# Patient Record
Sex: Male | Born: 1990
Health system: Southern US, Community
[De-identification: ages and names within clinical notes are randomized; demographics above are authoritative.]

## PROBLEM LIST (undated history)

## (undated) DIAGNOSIS — J45909 Unspecified asthma, uncomplicated: Secondary | ICD-10-CM

## (undated) DIAGNOSIS — F419 Anxiety disorder, unspecified: Secondary | ICD-10-CM

## (undated) DIAGNOSIS — M1990 Unspecified osteoarthritis, unspecified site: Secondary | ICD-10-CM

## (undated) DIAGNOSIS — Z8669 Personal history of other diseases of the nervous system and sense organs: Secondary | ICD-10-CM

## (undated) DIAGNOSIS — T7840XA Allergy, unspecified, initial encounter: Secondary | ICD-10-CM

## (undated) DIAGNOSIS — A770 Spotted fever due to Rickettsia rickettsii: Secondary | ICD-10-CM

## (undated) HISTORY — DX: Spotted fever due to Rickettsia rickettsii: A77.0

## (undated) HISTORY — PX: WISDOM TOOTH EXTRACTION: SHX21

## (undated) HISTORY — DX: Personal history of other diseases of the nervous system and sense organs: Z86.69

## (undated) HISTORY — PX: TONSILLECTOMY: SUR1361

## (undated) HISTORY — DX: Allergy, unspecified, initial encounter: T78.40XA

## (undated) HISTORY — DX: Anxiety disorder, unspecified: F41.9

---
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
## 2009-06-26 ENCOUNTER — Emergency Department (HOSPITAL_BASED_OUTPATIENT_CLINIC_OR_DEPARTMENT_OTHER): Admission: EM | Admit: 2009-06-26 | Discharge: 2009-06-26 | Payer: Self-pay | Admitting: Emergency Medicine

## 2010-12-13 ENCOUNTER — Inpatient Hospital Stay (INDEPENDENT_AMBULATORY_CARE_PROVIDER_SITE_OTHER)
Admission: RE | Admit: 2010-12-13 | Discharge: 2010-12-13 | Disposition: A | Discharge status: Home / Self Care | Payer: PRIVATE HEALTH INSURANCE | Source: Ambulatory Visit | Attending: Family Medicine | Admitting: Family Medicine

## 2010-12-13 ENCOUNTER — Emergency Department (HOSPITAL_COMMUNITY)
Admission: EM | Admit: 2010-12-13 | Discharge: 2010-12-14 | Disposition: A | Discharge status: Home / Self Care | Payer: PRIVATE HEALTH INSURANCE | Attending: Emergency Medicine | Admitting: Emergency Medicine

## 2010-12-13 ENCOUNTER — Emergency Department (HOSPITAL_COMMUNITY): Payer: PRIVATE HEALTH INSURANCE

## 2010-12-13 DIAGNOSIS — J45909 Unspecified asthma, uncomplicated: Secondary | ICD-10-CM | POA: Insufficient documentation

## 2010-12-13 DIAGNOSIS — R197 Diarrhea, unspecified: Secondary | ICD-10-CM | POA: Insufficient documentation

## 2010-12-13 DIAGNOSIS — R109 Unspecified abdominal pain: Secondary | ICD-10-CM | POA: Insufficient documentation

## 2010-12-13 DIAGNOSIS — R630 Anorexia: Secondary | ICD-10-CM | POA: Insufficient documentation

## 2010-12-13 DIAGNOSIS — R1110 Vomiting, unspecified: Secondary | ICD-10-CM | POA: Insufficient documentation

## 2010-12-13 DIAGNOSIS — R1031 Right lower quadrant pain: Secondary | ICD-10-CM

## 2010-12-13 DIAGNOSIS — F411 Generalized anxiety disorder: Secondary | ICD-10-CM | POA: Insufficient documentation

## 2010-12-13 LAB — DIFFERENTIAL
Basophils Absolute: 0 10*3/uL (ref 0.0–0.1)
Basophils Relative: 1 % (ref 0–1)
Eosinophils Absolute: 0.2 10*3/uL (ref 0.0–0.7)
Eosinophils Relative: 4 % (ref 0–5)
Lymphocytes Relative: 32 % (ref 12–46)
Lymphs Abs: 2 10*3/uL (ref 0.7–4.0)
Monocytes Absolute: 0.5 10*3/uL (ref 0.1–1.0)
Monocytes Relative: 8 % (ref 3–12)
Neutro Abs: 3.5 10*3/uL (ref 1.7–7.7)
Neutrophils Relative %: 56 % (ref 43–77)

## 2010-12-13 LAB — CBC
HCT: 43.8 % (ref 39.0–52.0)
Hemoglobin: 15.2 g/dL (ref 13.0–17.0)
MCH: 30.5 pg (ref 26.0–34.0)
MCHC: 34.7 g/dL (ref 30.0–36.0)
MCV: 88 fL (ref 78.0–100.0)
Platelets: 304 10*3/uL (ref 150–400)
RBC: 4.98 MIL/uL (ref 4.22–5.81)
RDW: 12.4 % (ref 11.5–15.5)
WBC: 6.2 10*3/uL (ref 4.0–10.5)

## 2010-12-13 LAB — URINALYSIS, ROUTINE W REFLEX MICROSCOPIC
Bilirubin Urine: NEGATIVE
Glucose, UA: NEGATIVE mg/dL
Hgb urine dipstick: NEGATIVE
Ketones, ur: 15 mg/dL — AB
Nitrite: NEGATIVE
Protein, ur: NEGATIVE mg/dL
Specific Gravity, Urine: 1.027 (ref 1.005–1.030)
Urobilinogen, UA: 0.2 mg/dL (ref 0.0–1.0)
pH: 5.5 (ref 5.0–8.0)

## 2010-12-14 MED ORDER — IOHEXOL 300 MG/ML  SOLN
80.0000 mL | Freq: Once | INTRAMUSCULAR | Status: AC | PRN
  Administered 2010-12-14: 80 mL via INTRAVENOUS

## 2011-01-16 NOTE — Op Note (Signed)
NAMEQuill, Grinder Marquice                  ACCOUNT NO.:  [id number]   MEDICAL RECORD NO.:  [id number]          PATIENT TYPE:  AMB   LOCATION:  DSC                          FACILITY:  [facility]   PHYSICIAN:  Suzanna Obey, M.D.       DATE OF BIRTH:  1991-01-03   DATE OF PROCEDURE:  01/15/2008  DATE OF DISCHARGE:                               OPERATIVE REPORT   PREOPERATIVE DIAGNOSIS:  Chronic tonsillitis.   POSTOPERATIVE DIAGNOSIS:  Chronic tonsillitis.   SURGICAL PROCEDURE:  Bilateral tonsillectomy.   ANESTHESIA:  General.   ESTIMATED BLOOD LOSS:  Less than 10 mL.   INDICATIONS:  This is a 20 year old with problems of chronic tonsillitis  who had been refractory to medical therapy.  He as well as his parents  were informed the risks and benefits of the procedure and options were  discussed.  All questions were answered and consent was obtained.   OPERATION:  The patient was taken to the operating room and placed in a  supine position after adequate general endotracheal tube anesthesia, he  was placed in the Rose position, and draped in the usual sterile manner.  The left tonsil, I began making a left anterior tonsillar pillar  incision, identifying the capsule of tonsil and removing it with  electrocautery dissection and extended pretty far down the lateral  pharyngeal wall into the lingual tonsil area.  It was very large and  very cryptic with debris.  Right tonsil was the same.  The area was  irrigated with saline and there was good hemostasis.  The Crowe-Davis  was released and resuspended.  The patient was then awakened after  removing the Crowe-Davis, brought to recovery room in stable condition.  Counts were correct.           ______________________________  Suzanna Obey, M.D.     JB/MEDQ  D:  01/15/2008  T:  01/15/2008  Job:  [id number]   cc:   [facility]

## 2011-01-19 NOTE — Discharge Summary (Signed)
[place]. [facility]  Patient:    GIANFRANCO, ARAKI                           MRN: [id number] Adm. Date:  [id number] Disc. Date: [id number] Attending:  Delle Reining Dictator:   Florentina Jenny, M.D.                           Discharge Summary  HISTORY OF PRESENT ILLNESS:  Emile is a 20 year old male who is admitted with a history of elevated fever to 103 degrees, rash, and swelling along his face. He had a full septic work-up, including blood cultures and a lumbar puncture. The lumbar puncture results were normal and his CSF culture was negative.  His blood culture was also negative.  HOSPITAL COURSE:  He was admitted and started on IV fluids and observed.  He was also started on doxycycline for the possibility of [facility] spotted fever.  He also did have EBV and RMSF titers, which are pending at the time of dictation.  Upon discharge, the patient was tolerating good p.o., was back to normal activity, and was afebrile with p.r.n. Motrin.  DISCHARGE MEDICATIONS:  Doxycycline 100 mg p.o. b.i.d. for seven days.  FOLLOW-UP:  He was to follow up with Dr. Doreatha Massed office and to schedule an appointment if he was not doing well.  DISCHARGE INSTRUCTIONS:  He is to return to normal diet and activity.  DISCHARGE DIAGNOSIS:  Probable viral syndrome, rule out [facility] spotted fever. DD:  01/08/01 TD:  01/09/01 Job: [id number]

## 2011-05-25 LAB — INFLUENZA A AND B ANTIGEN (CONVERTED LAB): Influenza B Ag: NEGATIVE

## 2012-03-30 ENCOUNTER — Emergency Department (HOSPITAL_BASED_OUTPATIENT_CLINIC_OR_DEPARTMENT_OTHER)
Admission: EM | Admit: 2012-03-30 | Discharge: 2012-03-30 | Disposition: A | Discharge status: Home / Self Care | Payer: PRIVATE HEALTH INSURANCE | Attending: Emergency Medicine | Admitting: Emergency Medicine

## 2012-03-30 ENCOUNTER — Encounter (HOSPITAL_BASED_OUTPATIENT_CLINIC_OR_DEPARTMENT_OTHER): Payer: Self-pay | Admitting: *Deleted

## 2012-03-30 DIAGNOSIS — M129 Arthropathy, unspecified: Secondary | ICD-10-CM | POA: Insufficient documentation

## 2012-03-30 DIAGNOSIS — J029 Acute pharyngitis, unspecified: Secondary | ICD-10-CM | POA: Insufficient documentation

## 2012-03-30 HISTORY — DX: Unspecified osteoarthritis, unspecified site: M19.90

## 2012-03-30 LAB — RAPID STREP SCREEN (MED CTR MEBANE ONLY): Streptococcus, Group A Screen (Direct): NEGATIVE

## 2012-03-30 MED ORDER — IBUPROFEN 600 MG PO TABS: 600.0000 mg | ORAL_TABLET | Freq: Four times a day (QID) | ORAL | Status: AC | PRN

## 2012-03-30 MED ORDER — LIDOCAINE VISCOUS 2 % MT SOLN
20.0000 mL | Freq: Once | OROMUCOSAL | Status: AC
  Administered 2012-03-30: 15 mL via OROMUCOSAL
  Filled 2012-03-30: qty 15

## 2012-03-30 NOTE — ED Provider Notes (Signed)
History  This chart was scribed for Joseph Xie Smitty Cords, MD by Shari Heritage. The patient was seen in room MH03/MH03. Patient's care was started at 0048.      CSN: [id number]  Arrival date & time 03/30/12  0048   First MD Initiated Contact with Patient 03/30/12 [phone]      Chief Complaint  Patient presents with  . Sore Throat    (Consider location/radiation/quality/duration/timing/severity/associated sxs/prior treatment) Patient is a 21 y.o. male presenting with pharyngitis. The history is provided by the patient and the spouse.  Sore Throat This is a new problem. The current episode started more than 2 days ago. The problem occurs constantly. The problem has not changed since onset.Pertinent negatives include no chest pain, no abdominal pain, no headaches and no shortness of breath. Nothing aggravates the symptoms. The symptoms are relieved by narcotics. Treatments tried: vicodin. The treatment provided significant relief.   Jackston Oaxaca is a 21 y.o. male who presents to the Emergency Department complaining of intermittent sore throat that is moderate in severity onset 2 days ago. Patient has tried Hydrocodone with relief from symptoms. Patient denies any other symptoms. No f/c/r no change in voice no SOB no noisy breathing.  He has a medical history of arthritis. Patient has a surgical h/o tonsillectomy.  Past Medical History  Diagnosis Date  . Arthritis     Past Surgical History  Procedure Date  . Tonsillectomy     History reviewed. No pertinent family history.  History  Substance Use Topics  . Smoking status: Not on file  . Smokeless tobacco: Not on file  . Alcohol Use:       Review of Systems  Constitutional: Negative for fever and fatigue.  HENT: Positive for sore throat. Negative for drooling, trouble swallowing, neck pain, neck stiffness and voice change.   Respiratory: Negative for shortness of breath.   Cardiovascular: Negative for chest pain.    Gastrointestinal: Negative for abdominal pain.  Neurological: Negative for headaches.  All other systems reviewed and are negative.   A complete 10 system review of systems was obtained and all systems are negative except as noted in the HPI and PMH.   Allergies  Review of patient's allergies indicates no known allergies.  Home Medications   Current Outpatient Rx  Name Route Sig Dispense Refill  . ALBUTEROL SULFATE HFA 108 (90 BASE) MCG/ACT IN AERS Inhalation Inhale 2 puffs into the lungs every 6 (six) hours as needed.      BP 138/84  Pulse 70  Temp 98.4 F (36.9 C) (Oral)  Resp 20  Ht 5\' 9"  (1.753 m)  Wt 155 lb (70.308 kg)  BMI 22.89 kg/m2  SpO2 100%  Physical Exam  Nursing note reviewed. Constitutional: He is oriented to person, place, and time. He appears well-developed and well-nourished.  HENT:  Head: Normocephalic and atraumatic.  Mouth/Throat: Oropharynx is clear and moist. No oropharyngeal exudate.       No swelling of salivary glands.  Eyes: Conjunctivae are normal. Pupils are equal, round, and reactive to light.  Neck: Normal range of motion. Neck supple. No tracheal deviation present. No thyromegaly present.       Trachea is midline.  Cardiovascular: Normal rate and regular rhythm.   Pulmonary/Chest: Effort normal and breath sounds normal. No stridor. No respiratory distress. He has no wheezes. He has no rales.  Abdominal: Soft. Normal appearance and bowel sounds are normal. He exhibits no distension. There is no tenderness. There is no rebound and no  guarding.  Musculoskeletal: Normal range of motion.  Lymphadenopathy:    He has no cervical adenopathy.  Neurological: He is alert and oriented to person, place, and time. He has normal reflexes.  Skin: Skin is warm and dry.  Psychiatric: He has a normal mood and affect.    ED Course  Procedures (including critical care time) DIAGNOSTIC STUDIES: Oxygen Saturation is 100% on room air, normal by my  interpretation.    COORDINATION OF CARE: 1:10am- Patient informed of current plan for treatment and evaluation and agrees with plan at this time. Have ordered strep test.  Results for orders placed during the hospital encounter of 03/30/12  RAPID STREP SCREEN      Component Value Range   Streptococcus, Group A Screen (Direct) NEGATIVE  NEGATIVE     No diagnosis found.    MDM  Return for inability to open mouth, inability to swallow, change in voice, swelling or any SOB. Follow up with family doctor within two days.  Patient verbalizes understanding and agrees to follow up      I personally performed the services described in this documentation, which was scribed in my presence. The recorded information has been reviewed and considered.    Jasmine Awe, MD 03/30/12 [phone]

## 2012-03-30 NOTE — ED Notes (Signed)
Pt presents to ED today with sore throat since Wed/Thurs.  Pt states associated sx with HA.  Pt has tried several OTC remedies with no relief in sx.  Swab sent from triage

## 2013-12-17 ENCOUNTER — Emergency Department (HOSPITAL_BASED_OUTPATIENT_CLINIC_OR_DEPARTMENT_OTHER)
Admission: EM | Admit: 2013-12-17 | Discharge: 2013-12-17 | Disposition: A | Discharge status: Home / Self Care | Payer: BC Managed Care – PPO | Attending: Emergency Medicine | Admitting: Emergency Medicine

## 2013-12-17 ENCOUNTER — Encounter (HOSPITAL_BASED_OUTPATIENT_CLINIC_OR_DEPARTMENT_OTHER): Payer: Self-pay | Admitting: Emergency Medicine

## 2013-12-17 ENCOUNTER — Emergency Department (HOSPITAL_BASED_OUTPATIENT_CLINIC_OR_DEPARTMENT_OTHER): Payer: BC Managed Care – PPO

## 2013-12-17 DIAGNOSIS — M25512 Pain in left shoulder: Secondary | ICD-10-CM

## 2013-12-17 DIAGNOSIS — M129 Arthropathy, unspecified: Secondary | ICD-10-CM | POA: Insufficient documentation

## 2013-12-17 DIAGNOSIS — F17200 Nicotine dependence, unspecified, uncomplicated: Secondary | ICD-10-CM | POA: Insufficient documentation

## 2013-12-17 DIAGNOSIS — Z79899 Other long term (current) drug therapy: Secondary | ICD-10-CM | POA: Insufficient documentation

## 2013-12-17 DIAGNOSIS — M25519 Pain in unspecified shoulder: Secondary | ICD-10-CM | POA: Insufficient documentation

## 2013-12-17 MED ORDER — CYCLOBENZAPRINE HCL 10 MG PO TABS
5.0000 mg | ORAL_TABLET | Freq: Once | ORAL | Status: AC
  Administered 2013-12-17: 5 mg via ORAL
  Filled 2013-12-17: qty 1

## 2013-12-17 MED ORDER — HYDROCODONE-ACETAMINOPHEN 5-325 MG PO TABS: 1.0000 | ORAL_TABLET | Freq: Four times a day (QID) | ORAL | Status: DC | PRN

## 2013-12-17 MED ORDER — CYCLOBENZAPRINE HCL 10 MG PO TABS: 10.0000 mg | ORAL_TABLET | Freq: Two times a day (BID) | ORAL | Status: DC | PRN

## 2013-12-17 MED ORDER — HYDROMORPHONE HCL PF 1 MG/ML IJ SOLN
1.0000 mg | Freq: Once | INTRAMUSCULAR | Status: AC
  Administered 2013-12-17: 1 mg via INTRAMUSCULAR
  Filled 2013-12-17: qty 1

## 2013-12-17 NOTE — ED Notes (Signed)
Pt c/o left shoulder pain w/o injury x 2 days

## 2013-12-17 NOTE — ED Provider Notes (Signed)
CSN: [id number]     Arrival date & time 12/17/13  2027 History  This chart was scribed for Joseph HaitWilliam Nesreen Albano, MD by Joaquin MusicKristina Sanchez-Matthews, ED Scribe. This patient was seen in room MHT13/MHT13 and the patient's care was started at 9:14 PM.   Chief Complaint  Patient presents with  . Shoulder Pain   Patient is a 23 y.o. male presenting with shoulder injury. The history is provided by the patient. No language interpreter was used.  Shoulder Injury This is a new problem. The current episode started 2 days ago. The problem occurs constantly. The problem has not changed since onset.Nothing aggravates the symptoms. Nothing relieves the symptoms. He has tried nothing for the symptoms. The treatment provided no relief.   HPI Comments: Joseph Crossric Afonso is a 23 y.o. male who presents to the Emergency Department complaining of ongoing L shoulder pain that began 2 days ago. Pt states his pain is now radiating down his L arm and occasionally to his neck and can shoot up his head. Pt states he is unsure if he injured his shoulder/arm while at work. He states he is unable to do certain movements with his L arm secondary to pain. He is L handed. He reports taking OTC Ibuprofen at noon without relief. Pt denies any recent falls, neck pain or stiffness.   Past Medical History  Diagnosis Date  . Arthritis    Past Surgical History  Procedure Laterality Date  . Tonsillectomy     History reviewed. No pertinent family history. History  Substance Use Topics  . Smoking status: Current Every Day Smoker -- 0.50 packs/day    Types: Cigarettes  . Smokeless tobacco: Not on file  . Alcohol Use: No    Review of Systems  All other systems reviewed and are negative.  Allergies  Review of patient's allergies indicates no known allergies.  Home Medications   Prior to Admission medications   Medication Sig Start Date End Date Taking? Authorizing Provider  ibuprofen (ADVIL,MOTRIN) 800 MG tablet Take 800 mg by  mouth every 8 (eight) hours as needed.   Yes Historical Provider, MD  albuterol (PROVENTIL HFA;VENTOLIN HFA) 108 (90 BASE) MCG/ACT inhaler Inhale 2 puffs into the lungs every 6 (six) hours as needed.    Historical Provider, MD   Triage Vitals:BP 138/86  Pulse 65  Temp(Src) 98.2 F (36.8 C) (Oral)  Resp 18  Ht 5\' 9"  (1.753 m)  Wt 160 lb (72.576 kg)  BMI 23.62 kg/m2  SpO2 98%  Physical Exam  Nursing note and vitals reviewed. Constitutional: He is oriented to person, place, and time. He appears well-developed and well-nourished. No distress.  HENT:  Head: Normocephalic and atraumatic.  Eyes: EOM are normal.  Neck: Neck supple. No tracheal deviation present.  Cardiovascular: Normal rate.   Pulmonary/Chest: Effort normal. No respiratory distress.  Musculoskeletal:       Left shoulder: He exhibits decreased range of motion, tenderness (deltoid, diffuse), pain and spasm. He exhibits no bony tenderness, no swelling, no deformity, no laceration, normal pulse and normal strength.  Neurological: He is alert and oriented to person, place, and time.  Skin: Skin is warm and dry.  Psychiatric: He has a normal mood and affect. His behavior is normal.    ED Course  Procedures  DIAGNOSTIC STUDIES: Oxygen Saturation is 98% on RA, normal by my interpretation.    COORDINATION OF CARE: 9:17 PM-Discussed treatment plan which includes discuss radiology findings and will give pain shot while at work. Advised pt  to take Alieve, apply heat/ice and rest over the weekend. Pt agreed to plan.   Labs Review Labs Reviewed - No data to display  Imaging Review Dg Shoulder Left  12/17/2013   CLINICAL DATA:  SHOULDER PAIN  EXAM: LEFT SHOULDER - 2+ VIEW  COMPARISON:  None.  FINDINGS: There is no evidence of fracture or dislocation. There is no evidence of arthropathy or other focal bone abnormality. Soft tissues are unremarkable.  IMPRESSION: Negative.   Electronically Signed   By: Salome HolmesHector  Cooper M.D.   On:  12/17/2013 21:04     EKG Interpretation None     MDM   Final diagnoses:  Left shoulder pain    89M with L shoulder pain. No known injury. Xray negative. Pain worse with shoulder movement. No fevers, no joint swelling. On exam, no warmth, no swelling. No concern for septic arthritis. Pain with ROM, rotator cuff movements. ROM decreased, but still has some ROM preserved. NVI distally. Like musculoskeletal in origin. Given pain meds, instructed to use NSAIDs. Given time off work to recover.  I personally performed the services described in this documentation, which was scribed in my presence. The recorded information has been reviewed and is accurate.     Joseph HaitWilliam Ramata Strothman, MD 12/17/13 [phone]

## 2013-12-17 NOTE — Discharge Instructions (Signed)
   Shoulder Pain The shoulder is the joint that connects your arms to your body. The bones that form the shoulder joint include the upper arm bone (humerus), the shoulder blade (scapula), and the collarbone (clavicle). The top of the humerus is shaped like a ball and fits into a rather flat socket on the scapula (glenoid cavity). A combination of muscles and strong, fibrous tissues that connect muscles to bones (tendons) support your shoulder joint and hold the ball in the socket. Small, fluid-filled sacs (bursae) are located in different areas of the joint. They act as cushions between the bones and the overlying soft tissues and help reduce friction between the gliding tendons and the bone as you move your arm. Your shoulder joint allows a wide range of motion in your arm. This range of motion allows you to do things like scratch your back or throw a ball. However, this range of motion also makes your shoulder more prone to pain from overuse and injury. Causes of shoulder pain can originate from both injury and overuse and usually can be grouped in the following four categories:  Redness, swelling, and pain (inflammation) of the tendon (tendinitis) or the bursae (bursitis).  Instability, such as a dislocation of the joint.  Inflammation of the joint (arthritis).  Broken bone (fracture). HOME CARE INSTRUCTIONS   Apply ice to the sore area.  Put ice in a plastic bag.  Place a towel between your skin and the bag.  Leave the ice on for 15-20 minutes, 03-04 times per day for the first 2 days.  Stop using cold packs if they do not help with the pain.  If you have a shoulder sling or immobilizer, wear it as long as your caregiver instructs. Only remove it to shower or bathe. Move your arm as little as possible, but keep your hand moving to prevent swelling.  Squeeze a soft ball or foam pad as much as possible to help prevent swelling.  Only take over-the-counter or prescription medicines for  pain, discomfort, or fever as directed by your caregiver. SEEK MEDICAL CARE IF:   Your shoulder pain increases, or new pain develops in your arm, hand, or fingers.  Your hand or fingers become cold and numb.  Your pain is not relieved with medicines. SEEK IMMEDIATE MEDICAL CARE IF:   Your arm, hand, or fingers are numb or tingling.  Your arm, hand, or fingers are significantly swollen or turn white or blue. MAKE SURE YOU:   Understand these instructions.  Will watch your condition.  Will get help right away if you are not doing well or get worse. Document Released: 05/30/2005 Document Revised: 05/14/2012 Document Reviewed: 08/04/2011 Exit[employer] Patient Information 2014 [employer].  

## 2013-12-17 NOTE — ED Notes (Signed)
[facility] pt to follow up if pain > 1 week

## 2014-05-28 ENCOUNTER — Ambulatory Visit (INDEPENDENT_AMBULATORY_CARE_PROVIDER_SITE_OTHER): Payer: BC Managed Care – PPO | Admitting: Physician Assistant

## 2014-05-28 VITALS — BP 110/64 | HR 75 | Temp 98.0°F | Resp 16 | Ht 69.0 in | Wt 170.0 lb

## 2014-05-28 DIAGNOSIS — G4700 Insomnia, unspecified: Secondary | ICD-10-CM

## 2014-05-28 MED ORDER — DIAZEPAM 2 MG PO TABS: 1.0000 mg | ORAL_TABLET | Freq: Three times a day (TID) | ORAL | Status: DC | PRN

## 2014-05-28 MED ORDER — BUPROPION HCL ER (XL) 150 MG PO TB24: 150.0000 mg | ORAL_TABLET | Freq: Every day | ORAL | Status: DC

## 2014-05-28 NOTE — Progress Notes (Signed)
   Subjective:    Patient ID: Joseph Cordova, male    DOB: 11-07-1990, 23 y.o.   MRN: [id number]  HPI Pt presents to clinic for evaluation of anxiety, panic attacks in crowds (agitated and irritable) and ADD.  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  He has trouble with focus at work - trouble getting his tasks done - sometimes trouble because he starts multiple tasks and then gets not one completed and sometimes his mind goes in different directions and then he also gets nothing done.  He also has generalized anxiety most days that affects him.  He seems to have gotten worse with both of this since Oct 22, 2022 when his uncle died, who was his role model.)  He has some sad mood and some lack of interest in doing things that he used to enjoy.  He has been on Paxil in the past but never was comfortable with the doctor and was not really interested in doing the treatment.  Review of Systems  Neurological: Positive for headaches (for years).  Psychiatric/Behavioral: Positive for dysphoric mood (mild) and decreased concentration. The patient is nervous/anxious.        Objective:   Physical Exam  Vitals reviewed. Constitutional: He is oriented to person, place, and time. He appears well-developed and well-nourished.  HENT:  Head: Normocephalic and atraumatic.  Right Ear: External ear normal.  Left Ear: External ear normal.  Pulmonary/Chest: Effort normal.  Neurological: He is alert and oriented to person, place, and time.  Skin: Skin is warm and dry.  Psychiatric: He has a normal mood and affect. His behavior is normal. Judgment and thought content normal.  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  We are  going to start Wellbutrin which will help both his anxiety and his ADD.  We will also give him something to help with sleep and during panic attacks while the Wellbutrin is getting into his system.  Once we get his anxiety under controlled we will start to determine what aspect of his focus is still a problem and treat that.  He will recheck in 3 weeks.  Spent 30 mints face to face with patient taking his history.  Benny Lennert PA-C  [facility] 05/28/2014 4:07 PM

## 2014-06-01 ENCOUNTER — Encounter: Payer: Self-pay | Admitting: Physician Assistant

## 2014-06-20 ENCOUNTER — Other Ambulatory Visit: Payer: Self-pay | Admitting: Physician Assistant

## 2014-06-20 DIAGNOSIS — G4700 Insomnia, unspecified: Secondary | ICD-10-CM

## 2014-06-22 MED ORDER — BUPROPION HCL ER (XL) 300 MG PO TB24: 300.0000 mg | ORAL_TABLET | Freq: Every day | ORAL | Status: DC

## 2014-06-22 MED ORDER — CLONAZEPAM 0.5 MG PO TABS: 0.2500 mg | ORAL_TABLET | Freq: Three times a day (TID) | ORAL | Status: DC | PRN

## 2014-06-22 NOTE — Telephone Encounter (Signed)
Please send to the pharmacy.  Pt knows about the medication change through mychart.

## 2014-06-23 NOTE — Telephone Encounter (Signed)
Called in clonazepam.

## 2014-06-27 ENCOUNTER — Ambulatory Visit (INDEPENDENT_AMBULATORY_CARE_PROVIDER_SITE_OTHER): Payer: BC Managed Care – PPO | Admitting: Physician Assistant

## 2014-06-27 VITALS — BP 116/68 | HR 61 | Temp 97.9°F | Resp 18 | Ht 67.5 in | Wt 170.2 lb

## 2014-06-27 DIAGNOSIS — J4520 Mild intermittent asthma, uncomplicated: Secondary | ICD-10-CM

## 2014-06-27 DIAGNOSIS — R41840 Attention and concentration deficit: Secondary | ICD-10-CM

## 2014-06-27 DIAGNOSIS — F419 Anxiety disorder, unspecified: Secondary | ICD-10-CM

## 2014-06-27 MED ORDER — METHYLPHENIDATE HCL 10 MG PO TABS: 10.0000 mg | ORAL_TABLET | Freq: Two times a day (BID) | ORAL | Status: DC

## 2014-06-27 MED ORDER — LORAZEPAM 0.5 MG PO TABS: 0.5000 mg | ORAL_TABLET | Freq: Two times a day (BID) | ORAL | Status: DC | PRN

## 2014-06-27 MED ORDER — ALBUTEROL SULFATE HFA 108 (90 BASE) MCG/ACT IN AERS: 2.0000 | INHALATION_SPRAY | Freq: Four times a day (QID) | RESPIRATORY_TRACT | Status: DC | PRN

## 2014-06-27 MED ORDER — ESCITALOPRAM OXALATE 10 MG PO TABS: 10.0000 mg | ORAL_TABLET | Freq: Every day | ORAL | Status: DC

## 2014-06-27 NOTE — Patient Instructions (Addendum)
Start Lexapro. Continue Klonopin at night. Use Ativan during the day as needed for increased anxiety/stress.  In 2 weeks start taking a ritalin in the am and see how that works - let me know the results - increased ability to focus and how long is it lasting --

## 2014-06-27 NOTE — Progress Notes (Signed)
   Subjective:    Patient ID: Joseph Cordova, male    DOB: Feb 16, 1991, 23 y.o.   MRN: [id number]  HPI  Pt presents to clinic for a recheck.  He did not tolerate the Wellbutrin - it made him angry and when his dose was increased the anger got worse.  He stopped it 2 days ago and then anger has resolved and he feels no withdrawal symptoms of HA or dizziness.  He felt like the Valium helped at times with his anxiety in certain situations and at other times he felt no relief - it did not help him sleep.  The klonopin has really helped him sleep and his girlfriend thinks that he has been better since his sleep is better.  He is unable to take the Klonopin during the day due to drowsiness.  He is still really worried about his focus at work and he feels like that when his focus is decreased that increases his anxiety.  Review of Systems  Constitutional: Negative for fever and chills.  Psychiatric/Behavioral: Positive for sleep disturbance (better on medications), dysphoric mood and decreased concentration. The patient is nervous/anxious.        Objective:   Physical Exam  Vitals reviewed. Constitutional: He is oriented to person, place, and time. He appears well-developed and well-nourished.  HENT:  Head: Normocephalic and atraumatic.  Right Ear: External ear normal.  Left Ear: External ear normal.  Pulmonary/Chest: Effort normal.  Neurological: He is alert and oriented to person, place, and time.  Skin: Skin is warm and dry.  Psychiatric: His behavior is normal. Judgment and thought content normal.  Flat affect       Assessment & Plan:  Asthma, mild intermittent, uncomplicated - Refill patients medication - he uses this rarely but it out at home.  Plan: albuterol (PROVENTIL HFA;VENTOLIN HFA) 108 (90 BASE) MCG/ACT inhaler  Decreased attention Span - Plan: methylphenidate (RITALIN) 10 MG tablet  Anxiety - Plan: escitalopram (LEXAPRO) 10 MG tablet, LORazepam (ATIVAN) 0.5 MG tablet  I think  that the patient is improved with the improved sleep - we will continue the Klonopin at night.  We will try Ativan for prn use daytime anxiety and irritability.  We are going to start Lexapro.  Once he has been on that for about 2 weeks he will start Ritalin to see if his focus improves.  He will contact me with results and see me in 4 weeks for a recheck.  Patient Instructions  Start Lexapro. Continue Klonopin at night. Use Ativan during the day as needed for increased anxiety/stress.  In 2 weeks start taking a ritalin in the am and see how that works - let me know the results - increased ability to focus and how long is it lasting --      Benny LennertSarah Weber PA-C  [facility] 06/27/2014 4:00 PM

## 2014-07-15 ENCOUNTER — Encounter: Payer: Self-pay | Admitting: Physician Assistant

## 2014-07-15 DIAGNOSIS — G4700 Insomnia, unspecified: Secondary | ICD-10-CM

## 2014-07-15 MED ORDER — CLONAZEPAM 0.5 MG PO TABS: 0.2500 mg | ORAL_TABLET | Freq: Three times a day (TID) | ORAL | Status: DC | PRN

## 2014-07-15 NOTE — Telephone Encounter (Signed)
Please send Rx to pharmacy.  Pt knows about it through mychart.

## 2014-07-16 NOTE — Telephone Encounter (Signed)
Called in.

## 2014-07-21 ENCOUNTER — Encounter: Payer: Self-pay | Admitting: Physician Assistant

## 2014-07-27 ENCOUNTER — Encounter: Payer: Self-pay | Admitting: Physician Assistant

## 2014-07-27 MED ORDER — AMPHETAMINE-DEXTROAMPHETAMINE 10 MG PO TABS: 10.0000 mg | ORAL_TABLET | Freq: Every day | ORAL | Status: DC

## 2014-07-27 NOTE — Telephone Encounter (Signed)
Pt knows about the Rx through mychart.  Please place up front.

## 2014-07-30 ENCOUNTER — Other Ambulatory Visit: Payer: Self-pay | Admitting: Physician Assistant

## 2014-07-30 DIAGNOSIS — F419 Anxiety disorder, unspecified: Secondary | ICD-10-CM

## 2014-08-03 MED ORDER — LORAZEPAM 0.5 MG PO TABS: 0.5000 mg | ORAL_TABLET | Freq: Two times a day (BID) | ORAL | Status: DC | PRN

## 2014-08-03 MED ORDER — ESCITALOPRAM OXALATE 10 MG PO TABS: 10.0000 mg | ORAL_TABLET | Freq: Every day | ORAL | Status: DC

## 2014-08-03 NOTE — Telephone Encounter (Signed)
Pt knows through mychart 

## 2014-08-03 NOTE — Telephone Encounter (Signed)
Faxed

## 2014-08-16 ENCOUNTER — Telehealth: Payer: Self-pay | Admitting: Physician Assistant

## 2014-08-16 DIAGNOSIS — G4700 Insomnia, unspecified: Secondary | ICD-10-CM

## 2014-08-17 MED ORDER — CLONAZEPAM 0.5 MG PO TABS: 0.2500 mg | ORAL_TABLET | Freq: Three times a day (TID) | ORAL | Status: DC | PRN

## 2014-08-17 NOTE — Telephone Encounter (Signed)
Rx ready.  Pt knows through my chart.

## 2014-08-17 NOTE — Telephone Encounter (Signed)
Faxed script to [facility].

## 2014-08-30 ENCOUNTER — Other Ambulatory Visit: Payer: Self-pay | Admitting: Physician Assistant

## 2014-08-30 DIAGNOSIS — F419 Anxiety disorder, unspecified: Secondary | ICD-10-CM

## 2014-09-02 MED ORDER — ESCITALOPRAM OXALATE 10 MG PO TABS: 10.0000 mg | ORAL_TABLET | Freq: Every day | ORAL | Status: DC

## 2014-09-02 MED ORDER — AMPHETAMINE-DEXTROAMPHETAMINE 10 MG PO TABS: 10.0000 mg | ORAL_TABLET | Freq: Every day | ORAL | Status: DC

## 2014-09-02 NOTE — Telephone Encounter (Signed)
Rx in drawer. 

## 2014-09-02 NOTE — Telephone Encounter (Signed)
Please place up front - pt knows through my chart

## 2014-09-21 ENCOUNTER — Encounter: Payer: Self-pay | Admitting: Physician Assistant

## 2014-09-21 DIAGNOSIS — G4700 Insomnia, unspecified: Secondary | ICD-10-CM

## 2014-09-28 ENCOUNTER — Other Ambulatory Visit: Payer: Self-pay | Admitting: Physician Assistant

## 2014-10-01 MED ORDER — CLONAZEPAM 0.5 MG PO TABS: 0.2500 mg | ORAL_TABLET | Freq: Three times a day (TID) | ORAL | Status: DC | PRN

## 2014-10-01 NOTE — Telephone Encounter (Signed)
Ready - pt knows through mychart. 

## 2014-10-01 NOTE — Telephone Encounter (Signed)
Faxed

## 2014-10-03 ENCOUNTER — Encounter: Payer: Self-pay | Admitting: Physician Assistant

## 2014-10-03 ENCOUNTER — Ambulatory Visit (INDEPENDENT_AMBULATORY_CARE_PROVIDER_SITE_OTHER): Payer: BLUE CROSS/BLUE SHIELD | Admitting: Physician Assistant

## 2014-10-03 VITALS — BP 102/54 | HR 74 | Temp 98.3°F | Resp 18 | Ht 67.75 in | Wt 162.6 lb

## 2014-10-03 DIAGNOSIS — G4700 Insomnia, unspecified: Secondary | ICD-10-CM | POA: Insufficient documentation

## 2014-10-03 DIAGNOSIS — F988 Other specified behavioral and emotional disorders with onset usually occurring in childhood and adolescence: Secondary | ICD-10-CM | POA: Insufficient documentation

## 2014-10-03 DIAGNOSIS — F419 Anxiety disorder, unspecified: Secondary | ICD-10-CM

## 2014-10-03 DIAGNOSIS — R41840 Attention and concentration deficit: Secondary | ICD-10-CM

## 2014-10-03 DIAGNOSIS — M62830 Muscle spasm of back: Secondary | ICD-10-CM

## 2014-10-03 DIAGNOSIS — F411 Generalized anxiety disorder: Secondary | ICD-10-CM | POA: Insufficient documentation

## 2014-10-03 MED ORDER — TRAZODONE HCL 50 MG PO TABS: 25.0000 mg | ORAL_TABLET | Freq: Every evening | ORAL | Status: DC | PRN

## 2014-10-03 MED ORDER — CYCLOBENZAPRINE HCL 5 MG PO TABS: 5.0000 mg | ORAL_TABLET | Freq: Every day | ORAL | Status: DC

## 2014-10-03 MED ORDER — AMPHETAMINE-DEXTROAMPHETAMINE 10 MG PO TABS: 10.0000 mg | ORAL_TABLET | Freq: Two times a day (BID) | ORAL | Status: DC

## 2014-10-03 MED ORDER — ESCITALOPRAM OXALATE 10 MG PO TABS: ORAL_TABLET | ORAL | Status: DC

## 2014-10-03 NOTE — Patient Instructions (Addendum)
Stay on Lexapro 10mg  each  Morning  Increase the Adderall to 2x/day  Start Trazodone - 1/2 pill at night for 3-4 nights and then you can increase by 1/2 pill every 3-4 nights until you either start to sleep at night and through the night or you reach a total of 150mg   A muscle relaxer (Flexeril) at night to help with you muscle tightness and muscle spasm. Do not start with this and the trazodone at the same time.

## 2014-10-03 NOTE — Progress Notes (Signed)
Subjective:    Patient ID: Joseph Cordova, male    DOB: 01-25-1991, 24 y.o.   MRN: [id number]  HPI Pt presents to clinic for a recheck of his anxiety and his ADD.  He feels like the Adderall is really helping.  He gets about 6 hours of good focus and his boss has noticed a difference in his work.  He typically only takes M-F because he does not want to take daily.  He has figured out that 5mg  is not enough in the morning.  He has tried a few doses of Adderall in the afternoon and 5 mg helps but it does not really make as much of a difference compared to the 10mg  in the am.  His anxiety is much better now that he is on Lexparo and his family has noticed it.  He has taken no Ativan during the day in a long time.  He has been using the Klonopin at night to help him sleep but he notices that it does not make him as tired as it used to and he can fall asleep but he still wakes up throughout the night and cannot get back to sleep.  He finds that he is not worried about anything but he just starts thinking about things.  He girlfriend states that he snores and will sometimes have pauses in his breathing while he is on his back but never when he is on his side.  His dad and mom both have sleep apnea.  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  He states his lower back hurts most of the time and he cannot stretch enough to get comfortable and he will sometimes get pain into his left butt cheek and into the back of his leg.  He has no paresthesias or weakness in his legs.  Review of Systems  Musculoskeletal: Positive for back pain. Negative for gait problem.  Neurological: Negative for weakness.  Psychiatric/Behavioral: Positive for sleep disturbance and decreased concentration (improved). The patient is nervous/anxious (improved).        Objective:   Physical Exam  Constitutional: He is oriented to person, place, and time. He  appears well-developed and well-nourished.  BP 102/54 mmHg  Pulse 74  Temp(Src) 98.3 F (36.8 C) (Oral)  Resp 18  Ht 5' 7.75" (1.721 m)  Wt 162 lb 9.6 oz (73.755 kg)  BMI 24.90 kg/m2  SpO2 97%   HENT:  Head: Normocephalic and atraumatic.  Right Ear: External ear normal.  Left Ear: External ear normal.  Pulmonary/Chest: Effort normal.  Musculoskeletal:       Lumbar back: He exhibits tenderness (bilateral paraspinal muscles statrting in the thoracic region of his back) and spasm (worse and more tender on the left side).  Neurological: He is alert and oriented to person, place, and time. He has normal strength. No sensory deficit.  Reflex Scores:      Patellar reflexes are 2+ on the right side and 2+ on the left side.      Achilles reflexes are 2+ on the right side and 2+ on the left side. Skin: Skin is warm and dry.  Psychiatric: He has a normal mood and affect. His behavior is normal. Judgment and thought content normal.      Assessment & Plan:  Decreased attention Span - Increase to bid - he has had good results - f/u in 6 months -  Plan: amphetamine-dextroamphetamine (ADDERALL) 10 MG tablet  Anxiety - much improved since starting the Lexapro and Adderall.  Plan: escitalopram (LEXAPRO) 10 MG tablet  Insomnia - We still need to adjust these medications - he is concerned about daily need for benzos and he would like to try something different.  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  We are going to try flexeril and trazodone and he will start with one and see the results and then change to the other to see different results.  They are going to continue to monitor his snoring- girlfriend has a pulse ox which they are going to use and if it continues we may think about doing a sleep study. Plan: traZODone (DESYREL) 50 MG tablet  Muscle spasm of back - Plan: cyclobenzaprine (FLEXERIL) 5 MG tablet   Benny Lennert PA-C    [facility] 10/03/2014 1:09 PM

## 2014-10-25 ENCOUNTER — Other Ambulatory Visit: Payer: Self-pay | Admitting: Physician Assistant

## 2014-10-28 ENCOUNTER — Other Ambulatory Visit: Payer: Self-pay | Admitting: Physician Assistant

## 2014-10-28 DIAGNOSIS — G4700 Insomnia, unspecified: Secondary | ICD-10-CM

## 2014-10-28 DIAGNOSIS — F419 Anxiety disorder, unspecified: Secondary | ICD-10-CM

## 2014-10-28 DIAGNOSIS — R41840 Attention and concentration deficit: Secondary | ICD-10-CM

## 2014-10-28 DIAGNOSIS — M62830 Muscle spasm of back: Secondary | ICD-10-CM

## 2014-10-31 ENCOUNTER — Emergency Department (HOSPITAL_BASED_OUTPATIENT_CLINIC_OR_DEPARTMENT_OTHER): Payer: BLUE CROSS/BLUE SHIELD

## 2014-10-31 ENCOUNTER — Encounter (HOSPITAL_BASED_OUTPATIENT_CLINIC_OR_DEPARTMENT_OTHER): Payer: Self-pay

## 2014-10-31 ENCOUNTER — Emergency Department (HOSPITAL_BASED_OUTPATIENT_CLINIC_OR_DEPARTMENT_OTHER)
Admission: EM | Admit: 2014-10-31 | Discharge: 2014-10-31 | Disposition: A | Discharge status: Home / Self Care | Payer: BLUE CROSS/BLUE SHIELD | Attending: Emergency Medicine | Admitting: Emergency Medicine

## 2014-10-31 DIAGNOSIS — M1990 Unspecified osteoarthritis, unspecified site: Secondary | ICD-10-CM | POA: Insufficient documentation

## 2014-10-31 DIAGNOSIS — W19XXXA Unspecified fall, initial encounter: Secondary | ICD-10-CM

## 2014-10-31 DIAGNOSIS — S43401A Unspecified sprain of right shoulder joint, initial encounter: Secondary | ICD-10-CM | POA: Insufficient documentation

## 2014-10-31 DIAGNOSIS — G43909 Migraine, unspecified, not intractable, without status migrainosus: Secondary | ICD-10-CM | POA: Diagnosis not present

## 2014-10-31 DIAGNOSIS — Z79899 Other long term (current) drug therapy: Secondary | ICD-10-CM | POA: Diagnosis not present

## 2014-10-31 DIAGNOSIS — S63501A Unspecified sprain of right wrist, initial encounter: Secondary | ICD-10-CM | POA: Insufficient documentation

## 2014-10-31 DIAGNOSIS — S199XXA Unspecified injury of neck, initial encounter: Secondary | ICD-10-CM | POA: Diagnosis not present

## 2014-10-31 DIAGNOSIS — Y9389 Activity, other specified: Secondary | ICD-10-CM | POA: Diagnosis not present

## 2014-10-31 DIAGNOSIS — W132XXA Fall from, out of or through roof, initial encounter: Secondary | ICD-10-CM | POA: Diagnosis not present

## 2014-10-31 DIAGNOSIS — S50819A Abrasion of unspecified forearm, initial encounter: Secondary | ICD-10-CM | POA: Diagnosis not present

## 2014-10-31 DIAGNOSIS — S4991XA Unspecified injury of right shoulder and upper arm, initial encounter: Secondary | ICD-10-CM | POA: Diagnosis present

## 2014-10-31 DIAGNOSIS — Y998 Other external cause status: Secondary | ICD-10-CM | POA: Diagnosis not present

## 2014-10-31 DIAGNOSIS — Z720 Tobacco use: Secondary | ICD-10-CM | POA: Insufficient documentation

## 2014-10-31 DIAGNOSIS — Y9289 Other specified places as the place of occurrence of the external cause: Secondary | ICD-10-CM | POA: Insufficient documentation

## 2014-10-31 DIAGNOSIS — Z8619 Personal history of other infectious and parasitic diseases: Secondary | ICD-10-CM | POA: Insufficient documentation

## 2014-10-31 DIAGNOSIS — S0990XA Unspecified injury of head, initial encounter: Secondary | ICD-10-CM | POA: Diagnosis not present

## 2014-10-31 DIAGNOSIS — S299XXA Unspecified injury of thorax, initial encounter: Secondary | ICD-10-CM | POA: Diagnosis not present

## 2014-10-31 DIAGNOSIS — F419 Anxiety disorder, unspecified: Secondary | ICD-10-CM | POA: Insufficient documentation

## 2014-10-31 MED ORDER — TRAZODONE HCL 50 MG PO TABS: 100.0000 mg | ORAL_TABLET | Freq: Every evening | ORAL | Status: DC | PRN

## 2014-10-31 MED ORDER — OXYCODONE-ACETAMINOPHEN 5-325 MG PO TABS
1.0000 | ORAL_TABLET | Freq: Once | ORAL | Status: AC
  Administered 2014-10-31: 1 via ORAL
  Filled 2014-10-31: qty 1

## 2014-10-31 MED ORDER — OXYCODONE-ACETAMINOPHEN 5-325 MG PO TABS: 1.0000 | ORAL_TABLET | Freq: Four times a day (QID) | ORAL | Status: DC | PRN

## 2014-10-31 MED ORDER — AMPHETAMINE-DEXTROAMPHETAMINE 10 MG PO TABS: 10.0000 mg | ORAL_TABLET | Freq: Two times a day (BID) | ORAL | Status: DC

## 2014-10-31 MED ORDER — CLONAZEPAM 0.5 MG PO TABS: 0.2500 mg | ORAL_TABLET | Freq: Three times a day (TID) | ORAL | Status: DC | PRN

## 2014-10-31 MED ORDER — CYCLOBENZAPRINE HCL 5 MG PO TABS: 5.0000 mg | ORAL_TABLET | Freq: Every day | ORAL | Status: DC

## 2014-10-31 MED ORDER — FENTANYL CITRATE 0.05 MG/ML IJ SOLN
50.0000 ug | Freq: Once | INTRAMUSCULAR | Status: AC
  Administered 2014-10-31: 50 ug via INTRAVENOUS
  Filled 2014-10-31: qty 2

## 2014-10-31 MED ORDER — ONDANSETRON HCL 4 MG/2ML IJ SOLN
4.0000 mg | Freq: Once | INTRAMUSCULAR | Status: AC
  Administered 2014-10-31: 4 mg via INTRAVENOUS
  Filled 2014-10-31: qty 2

## 2014-10-31 NOTE — ED Notes (Signed)
c-collar applied at triage 

## 2014-10-31 NOTE — ED Provider Notes (Signed)
CSN: [id number]     Arrival date & time 10/31/14  1410 History  This chart was scribed for [employer]. Rubin Payor, MD by Tonye Royalty, ED Scribe. This patient was seen in room MH04/MH04 and the patient's care was started at 3:16 PM.    Chief Complaint  Patient presents with  . fall from roof    The history is provided by the patient and the spouse. No language interpreter was used.    HPI Comments: Joseph Cordova is a 24 y.o. male who presents to the Emergency Department complaining of fall PTA. He states he was making repairs on his roof and fell approximately 10 feet, landing on grass. Questionable LOC. Wife states she found him lying on his right side. He complains of pain to his right shoulder and neck area. He states he has been ambulatory. He denies any history of problems to his right shoulder. He denies SOB.   Past Medical History  Diagnosis Date  . Arthritis   . Allergy   . Anxiety   . [facility] spotted fever   . History of migraine headaches    Past Surgical History  Procedure Laterality Date  . Tonsillectomy     Family History  Problem Relation Age of Onset  . Diabetes Mother   . Hyperlipidemia Mother   . Hypertension Mother   . Cancer Maternal Grandmother   . Diabetes Maternal Grandmother   . Hypertension Maternal Grandmother   . Hypertension Maternal Grandfather   . Hyperlipidemia Maternal Grandfather   . Diabetes Maternal Grandfather   . Cancer Paternal Grandmother   . Cancer Paternal Grandfather    History  Substance Use Topics  . Smoking status: Current Every Day Smoker -- 0.50 packs/day    Types: Cigarettes  . Smokeless tobacco: Never Used  . Alcohol Use: No    Review of Systems  Respiratory: Negative for shortness of breath.   Musculoskeletal: Positive for neck pain.       Right shoulder pain  Neurological: Positive for headaches.  All other systems reviewed and are negative.     Allergies  Wellbutrin  Home Medications   Prior to Admission  medications   Medication Sig Start Date End Date Taking? Authorizing Provider  albuterol (PROVENTIL HFA;VENTOLIN HFA) 108 (90 BASE) MCG/ACT inhaler Inhale 2 puffs into the lungs every 6 (six) hours as needed. 06/27/14   Morrell Riddle, PA-C  amphetamine-dextroamphetamine (ADDERALL) 10 MG tablet Take 1 tablet (10 mg total) by mouth 2 (two) times daily with a meal. 10/31/14   Morrell Riddle, PA-C  clonazePAM (KLONOPIN) 0.5 MG tablet Take 0.5-2 tablets (0.25-1 mg total) by mouth 3 (three) times daily as needed for anxiety. 10/31/14   Morrell Riddle, PA-C  cyclobenzaprine (FLEXERIL) 5 MG tablet Take 1 tablet (5 mg total) by mouth at bedtime. 10/31/14   Morrell Riddle, PA-C  escitalopram (LEXAPRO) 10 MG tablet TAKE 1 TABLET (10 MG TOTAL) BY MOUTH DAILY. 10/03/14   Morrell Riddle, PA-C  ibuprofen (ADVIL,MOTRIN) 800 MG tablet Take 800 mg by mouth every 8 (eight) hours as needed.    Historical Provider, MD  oxyCODONE-acetaminophen (PERCOCET/ROXICET) 5-325 MG per tablet Take 1-2 tablets by mouth every 6 (six) hours as needed for severe pain. 10/31/14   Juliet Rude. Karrine Kluttz, MD  traZODone (DESYREL) 50 MG tablet Take 2 tablets (100 mg total) by mouth at bedtime as needed for sleep. 10/31/14   Morrell Riddle, PA-C   BP 112/61 mmHg  Pulse 77  Temp(Src) 99.6 F (37.6 C) (Oral)  Resp 18  Ht  (1.753 m)  Wt 165 lb (74.844 kg)  BMI 24.36 kg/m2  SpO2 99% Physical Exam  Constitutional: He is oriented to person, place, and time. He appears well-developed and well-nourished.  HENT:  Head: Normocephalic and atraumatic.  No trauma to head  Eyes: Conjunctivae are normal.  Neck: Normal range of motion. Neck supple.  Pulmonary/Chest: Effort normal. He exhibits tenderness.  Some right upper lateral chest tenderness, no crepitance of deformity  Abdominal: Soft. There is no tenderness.  Musculoskeletal: Normal range of motion.  Some midline cervical spine tenderness to upper cervical spine Tenderness over right shoulder  anteriorly, laterally, and posteriorly Tenderness of scapula No tenderness of elbow Tenderness over wrist dorsally just medial from snuffbox No tenderness of right hand Good ROM, good grip strength, strong radial pulse No pelvic tenderness  Neurological: He is alert and oriented to person, place, and time.  Skin: Skin is warm and dry.  Abrasion to wrist and medial forearm distally  Psychiatric: He has a normal mood and affect.  Nursing note and vitals reviewed.   ED Course  Procedures (including critical care time)  COORDINATION OF CARE: 3:23 PM Discussed treatment plan with patient at beside, including x-rays of shoulder and chest, CT of head and cervical spine, and pain medication. The patient agrees with the plan and has no further questions at this time.   Labs Review Labs Reviewed - No data to display  Imaging Review Dg Chest 1 View  10/31/2014   CLINICAL DATA:  Larey Seat off roof from 10 feet. Landed on right side. Right shoulder pain.  EXAM: CHEST  1 VIEW  COMPARISON:  None.  FINDINGS: The heart size and mediastinal contours are within normal limits. Both lungs are clear. The visualized skeletal structures are unremarkable. No pneumothorax.  IMPRESSION: No active disease.   Electronically Signed   By: Charlett Nose M.D.   On: 10/31/2014 15:36   Dg Scapula Right  10/31/2014   CLINICAL DATA:  Trauma. Larey Seat off a roof this afternoon. Fell 10 feet. Landed on the right side. Posterior scapula pain.  EXAM: RIGHT SCAPULA - 2+ VIEWS  COMPARISON:  Chest x-ray and right shoulder 10/31/2014  FINDINGS: There is no evidence of fracture or other focal bone lesions. Soft tissues are unremarkable.  IMPRESSION: Negative.   Electronically Signed   By: Norva Pavlov M.D.   On: 10/31/2014 16:38   Dg Shoulder Right  10/31/2014   CLINICAL DATA:  Acute right shoulder pain after falling off roof. Initial encounter.  EXAM: RIGHT SHOULDER - 2+ VIEW  COMPARISON:  None.  FINDINGS: There is no evidence of  fracture or dislocation. There is no evidence of arthropathy or other focal bone abnormality. Soft tissues are unremarkable.  IMPRESSION: Normal right shoulder.   Electronically Signed   By: Lupita Raider, M.D.   On: 10/31/2014 15:38   Dg Wrist Complete Right  10/31/2014   CLINICAL DATA:  Fall from roof this afternoon. Injured right wrist. Posterior pain.  EXAM: RIGHT WRIST - COMPLETE 3+ VIEW  COMPARISON:  None.  FINDINGS: There is no evidence of fracture or dislocation. There is no evidence of arthropathy or other focal bone abnormality. Soft tissues are unremarkable.  IMPRESSION: Negative.   Electronically Signed   By: Charlett Nose M.D.   On: 10/31/2014 16:33   Ct Head Wo Contrast  10/31/2014   CLINICAL DATA:  Recent fall from the roof. Right neck  and shoulder pain. No headache.  EXAM: CT HEAD WITHOUT CONTRAST  CT CERVICAL SPINE WITHOUT CONTRAST  TECHNIQUE: Multidetector CT imaging of the head and cervical spine was performed following the standard protocol without intravenous contrast. Multiplanar CT image reconstructions of the cervical spine were also generated.  COMPARISON:  None.  FINDINGS: CT HEAD FINDINGS  There is no intra or extra-axial fluid collection or mass lesion. The basilar cisterns and ventricles have a normal appearance. There is no CT evidence for acute infarction or hemorrhage. Bone windows are unremarkable.  CT CERVICAL SPINE FINDINGS  There is normal alignment of the cervical spine. There is no evidence for acute fracture or dislocation. Prevertebral soft tissues have a normal appearance. Lung apices have a normal appearance.  IMPRESSION: 1.  No evidence for acute intracranial abnormality. 2. No acute cervical spine abnormality.   Electronically Signed   By: Norva PavlovElizabeth  Brown M.D.   On: 10/31/2014 15:45   Ct Cervical Spine Wo Contrast  10/31/2014   CLINICAL DATA:  Recent fall from the roof. Right neck and shoulder pain. No headache.  EXAM: CT HEAD WITHOUT CONTRAST  CT CERVICAL SPINE  WITHOUT CONTRAST  TECHNIQUE: Multidetector CT imaging of the head and cervical spine was performed following the standard protocol without intravenous contrast. Multiplanar CT image reconstructions of the cervical spine were also generated.  COMPARISON:  None.  FINDINGS: CT HEAD FINDINGS  There is no intra or extra-axial fluid collection or mass lesion. The basilar cisterns and ventricles have a normal appearance. There is no CT evidence for acute infarction or hemorrhage. Bone windows are unremarkable.  CT CERVICAL SPINE FINDINGS  There is normal alignment of the cervical spine. There is no evidence for acute fracture or dislocation. Prevertebral soft tissues have a normal appearance. Lung apices have a normal appearance.  IMPRESSION: 1.  No evidence for acute intracranial abnormality. 2. No acute cervical spine abnormality.   Electronically Signed   By: Norva PavlovElizabeth  Brown M.D.   On: 10/31/2014 15:45     EKG Interpretation None      MDM   Final diagnoses:  Fall, initial encounter  Shoulder sprain, right, initial encounter  Wrist sprain, right, initial encounter    Patient with fall. Shoulder pain and some neck pain. Also wrist pain. X-rays reassuring. Given sling for comfort and patient will follow up with sports medicine as needed.  I personally performed the services described in this documentation, which was scribed in my presence. The recorded information has been reviewed and is accurate.    Juliet RudeNathan R. Rubin PayorPickering, MD 10/31/14 [phone]

## 2014-10-31 NOTE — ED Notes (Signed)
Patient reports that he fell from metal roof landing on grass, approximately 10 feet. Questionable loc, complains of right shoulder, neck and headache

## 2014-10-31 NOTE — ED Notes (Signed)
Assumed care of patient from Steve, RN.  

## 2014-10-31 NOTE — Telephone Encounter (Signed)
Rx are ready pt knows by mychart.

## 2014-10-31 NOTE — Discharge Instructions (Signed)
Shoulder Sprain °A shoulder sprain is the result of damage to the tough, fiber-like tissues (ligaments) that help hold your shoulder in place. The ligaments may be stretched or torn. Besides the main shoulder joint (the ball and socket), there are several smaller joints that connect the bones in this area. A sprain usually involves one of those joints. Most often it is the acromioclavicular (or AC) joint. That is the joint that connects the collarbone (clavicle) and the shoulder blade (scapula) at the top point of the shoulder blade (acromion). °A shoulder sprain is a mild form of what is called a shoulder separation. Recovering from a shoulder sprain may take some time. For some, pain lingers for several months. Most people recover without long term problems. °CAUSES  °· A shoulder sprain is usually caused by some kind of trauma. This might be: °¨ Falling on an outstretched arm. °¨ Being hit hard on the shoulder. °¨ Twisting the arm. °· Shoulder sprains are more likely to occur in people who: °¨ Play sports. °¨ Have balance or coordination problems. °SYMPTOMS  °· Pain when you move your shoulder. °· Limited ability to move the shoulder. °· Swelling and tenderness on top of the shoulder. °· Redness or warmth in the shoulder. °· Bruising. °· A change in the shape of the shoulder. °DIAGNOSIS  °Your healthcare provider may: °· Ask about your symptoms. °· Ask about recent activity that might have caused those symptoms. °· Examine your shoulder. You may be asked to do simple exercises to test movement. The other shoulder will be examined for comparison. °· Order some tests that provide a look inside the body. They can show the extent of the injury. The tests could include: °¨ X-rays. °¨ CT (computed tomography) scan. °¨ MRI (magnetic resonance imaging) scan. °RISKS AND COMPLICATIONS °· Loss of full shoulder motion. °· Ongoing shoulder pain. °TREATMENT  °How long it takes to recover from a shoulder sprain depends on how  severe it was. Treatment options may include: °· Rest. You should not use the arm or shoulder until it heals. °· Ice. For 2 or 3 days after the injury, put an ice pack on the shoulder up to 4 times a day. It should stay on for 15 to 20 minutes each time. Wrap the ice in a towel so it does not touch your skin. °· Over-the-counter medicine to relieve pain. °· A sling or brace. This will keep the arm still while the shoulder is healing. °· Physical therapy or rehabilitation exercises. These will help you regain strength and motion. Ask your healthcare provider when it is OK to begin these exercises. °· Surgery. The need for surgery is rare with a sprained shoulder, but some people may need surgery to keep the joint in place and reduce pain. °HOME CARE INSTRUCTIONS  °· Ask your healthcare provider about what you should and should not do while your shoulder heals. °· Make sure you know how to apply ice to the correct area of your shoulder. °· Talk with your healthcare provider about which medications should be used for pain and swelling. °· If rehabilitation therapy will be needed, ask your healthcare provider to refer you to a therapist. If it is not recommended, then ask about at-home exercises. Find out when exercise should begin. °SEEK MEDICAL CARE IF:  °Your pain, swelling, or redness at the joint increases. °SEEK IMMEDIATE MEDICAL CARE IF:  °· You have a fever. °· You cannot move your arm or shoulder. °Document Released: 01/06/2009 Document   Revised: 11/12/2011 Document Reviewed: 01/06/2009 °[employer]® Patient Information ©2015 [employer]. This information is not intended to replace advice given to you by your health care provider. Make sure you discuss any questions you have with your health care provider. ° °

## 2014-11-01 MED ORDER — ESCITALOPRAM OXALATE 10 MG PO TABS: ORAL_TABLET | ORAL | Status: DC

## 2014-11-01 NOTE — Telephone Encounter (Signed)
Spoke with pt, advised Rx's ready to pick up. 

## 2014-11-23 ENCOUNTER — Telehealth: Payer: Self-pay | Admitting: Physician Assistant

## 2014-11-23 DIAGNOSIS — G4700 Insomnia, unspecified: Secondary | ICD-10-CM

## 2014-11-23 DIAGNOSIS — M62830 Muscle spasm of back: Secondary | ICD-10-CM

## 2014-11-23 DIAGNOSIS — R41840 Attention and concentration deficit: Secondary | ICD-10-CM

## 2014-11-26 ENCOUNTER — Telehealth: Payer: Self-pay

## 2014-11-26 MED ORDER — CYCLOBENZAPRINE HCL 5 MG PO TABS: 5.0000 mg | ORAL_TABLET | Freq: Every day | ORAL | Status: DC

## 2014-11-26 MED ORDER — AMPHETAMINE-DEXTROAMPHETAMINE 10 MG PO TABS: 10.0000 mg | ORAL_TABLET | Freq: Two times a day (BID) | ORAL | Status: DC

## 2014-11-26 MED ORDER — CLONAZEPAM 0.5 MG PO TABS: 0.2500 mg | ORAL_TABLET | Freq: Three times a day (TID) | ORAL | Status: DC | PRN

## 2014-11-26 MED ORDER — TRAZODONE HCL 50 MG PO TABS: 100.0000 mg | ORAL_TABLET | Freq: Every evening | ORAL | Status: DC | PRN

## 2014-11-26 NOTE — Telephone Encounter (Signed)
Rx ready - pt knows through mychart. 

## 2014-11-27 ENCOUNTER — Other Ambulatory Visit: Payer: Self-pay | Admitting: Physician Assistant

## 2014-12-27 ENCOUNTER — Other Ambulatory Visit: Payer: Self-pay | Admitting: Physician Assistant

## 2014-12-27 DIAGNOSIS — R41840 Attention and concentration deficit: Secondary | ICD-10-CM

## 2014-12-27 DIAGNOSIS — G4700 Insomnia, unspecified: Secondary | ICD-10-CM

## 2014-12-27 DIAGNOSIS — M62830 Muscle spasm of back: Secondary | ICD-10-CM

## 2014-12-30 ENCOUNTER — Telehealth: Payer: Self-pay

## 2014-12-30 NOTE — Telephone Encounter (Signed)
Patient is out of all of his medication.   Sarah please call    [phone] (H)

## 2014-12-30 NOTE — Telephone Encounter (Signed)
Left message to contact his pharmacy and have them send over requests. I have no idea what he needs refills on.

## 2014-12-31 ENCOUNTER — Telehealth: Payer: Self-pay | Admitting: Radiology

## 2014-12-31 MED ORDER — CLONAZEPAM 0.5 MG PO TABS: 0.2500 mg | ORAL_TABLET | Freq: Three times a day (TID) | ORAL | Status: DC | PRN

## 2014-12-31 MED ORDER — CYCLOBENZAPRINE HCL 5 MG PO TABS: 5.0000 mg | ORAL_TABLET | Freq: Every day | ORAL | Status: DC

## 2014-12-31 MED ORDER — AMPHETAMINE-DEXTROAMPHETAMINE 10 MG PO TABS: 10.0000 mg | ORAL_TABLET | Freq: Two times a day (BID) | ORAL | Status: DC

## 2014-12-31 NOTE — Telephone Encounter (Signed)
lmom to inform pt that rx's are ready for p/u.

## 2014-12-31 NOTE — Addendum Note (Signed)
Addended by: Morrell RiddleWEBER, Aunesty Tyson L on: 12/31/2014 11:56 AM   Modules accepted: Orders

## 2014-12-31 NOTE — Telephone Encounter (Signed)
Rx ready - pt knows through mychart. 

## 2015-01-24 ENCOUNTER — Telehealth: Payer: Self-pay | Admitting: Physician Assistant

## 2015-01-24 DIAGNOSIS — M62830 Muscle spasm of back: Secondary | ICD-10-CM

## 2015-01-24 DIAGNOSIS — G4700 Insomnia, unspecified: Secondary | ICD-10-CM

## 2015-01-24 DIAGNOSIS — R41840 Attention and concentration deficit: Secondary | ICD-10-CM

## 2015-01-24 MED ORDER — AMPHETAMINE-DEXTROAMPHETAMINE 10 MG PO TABS: 10.0000 mg | ORAL_TABLET | Freq: Two times a day (BID) | ORAL | Status: DC

## 2015-01-24 MED ORDER — CYCLOBENZAPRINE HCL 5 MG PO TABS: 5.0000 mg | ORAL_TABLET | Freq: Every day | ORAL | Status: DC

## 2015-01-24 MED ORDER — CLONAZEPAM 0.5 MG PO TABS: 0.2500 mg | ORAL_TABLET | Freq: Three times a day (TID) | ORAL | Status: DC | PRN

## 2015-01-24 NOTE — Telephone Encounter (Signed)
Done - pt knows through my chart 

## 2015-01-24 NOTE — Addendum Note (Signed)
Addended by: Morrell RiddleWEBER, Gaither Biehn L on: 01/24/2015 12:37 PM   Modules accepted: Orders

## 2015-02-25 ENCOUNTER — Telehealth: Payer: Self-pay | Admitting: Physician Assistant

## 2015-02-25 DIAGNOSIS — M62830 Muscle spasm of back: Secondary | ICD-10-CM

## 2015-02-25 DIAGNOSIS — R41840 Attention and concentration deficit: Secondary | ICD-10-CM

## 2015-02-25 DIAGNOSIS — G4700 Insomnia, unspecified: Secondary | ICD-10-CM

## 2015-02-28 MED ORDER — CLONAZEPAM 0.5 MG PO TABS: 0.2500 mg | ORAL_TABLET | Freq: Three times a day (TID) | ORAL | Status: DC | PRN

## 2015-02-28 MED ORDER — AMPHETAMINE-DEXTROAMPHETAMINE 10 MG PO TABS: 10.0000 mg | ORAL_TABLET | Freq: Two times a day (BID) | ORAL | Status: DC

## 2015-02-28 MED ORDER — CYCLOBENZAPRINE HCL 5 MG PO TABS: 5.0000 mg | ORAL_TABLET | Freq: Every day | ORAL | Status: DC

## 2015-02-28 NOTE — Telephone Encounter (Signed)
Done - pt knows through mychart to come and pick up

## 2015-02-28 NOTE — Addendum Note (Signed)
Addended by: Morrell Riddle on: 02/28/2015 09:58 AM   Modules accepted: Orders

## 2015-03-07 ENCOUNTER — Other Ambulatory Visit: Payer: Self-pay | Admitting: Physician Assistant

## 2015-03-15 ENCOUNTER — Other Ambulatory Visit: Payer: Self-pay | Admitting: Physician Assistant

## 2015-03-18 ENCOUNTER — Emergency Department (HOSPITAL_BASED_OUTPATIENT_CLINIC_OR_DEPARTMENT_OTHER)
Admission: EM | Admit: 2015-03-18 | Discharge: 2015-03-18 | Disposition: A | Discharge status: Home / Self Care | Payer: BLUE CROSS/BLUE SHIELD | Attending: Emergency Medicine | Admitting: Emergency Medicine

## 2015-03-18 ENCOUNTER — Encounter (HOSPITAL_BASED_OUTPATIENT_CLINIC_OR_DEPARTMENT_OTHER): Payer: Self-pay | Admitting: *Deleted

## 2015-03-18 DIAGNOSIS — F419 Anxiety disorder, unspecified: Secondary | ICD-10-CM | POA: Insufficient documentation

## 2015-03-18 DIAGNOSIS — Z720 Tobacco use: Secondary | ICD-10-CM | POA: Diagnosis not present

## 2015-03-18 DIAGNOSIS — M545 Low back pain, unspecified: Secondary | ICD-10-CM

## 2015-03-18 DIAGNOSIS — Z79899 Other long term (current) drug therapy: Secondary | ICD-10-CM | POA: Diagnosis not present

## 2015-03-18 DIAGNOSIS — M549 Dorsalgia, unspecified: Secondary | ICD-10-CM | POA: Diagnosis present

## 2015-03-18 DIAGNOSIS — Z8619 Personal history of other infectious and parasitic diseases: Secondary | ICD-10-CM | POA: Insufficient documentation

## 2015-03-18 DIAGNOSIS — G43909 Migraine, unspecified, not intractable, without status migrainosus: Secondary | ICD-10-CM | POA: Insufficient documentation

## 2015-03-18 DIAGNOSIS — M1990 Unspecified osteoarthritis, unspecified site: Secondary | ICD-10-CM | POA: Insufficient documentation

## 2015-03-18 MED ORDER — NAPROXEN 500 MG PO TABS: 500.0000 mg | ORAL_TABLET | Freq: Two times a day (BID) | ORAL | Status: DC

## 2015-03-18 MED ORDER — TIZANIDINE HCL 2 MG PO CAPS: 4.0000 mg | ORAL_CAPSULE | Freq: Three times a day (TID) | ORAL | Status: DC

## 2015-03-18 MED ORDER — HYDROMORPHONE HCL 1 MG/ML IJ SOLN
1.0000 mg | Freq: Once | INTRAMUSCULAR | Status: AC
  Administered 2015-03-18: 1 mg via INTRAMUSCULAR
  Filled 2015-03-18: qty 1

## 2015-03-18 MED ORDER — CYCLOBENZAPRINE HCL 10 MG PO TABS
10.0000 mg | ORAL_TABLET | Freq: Once | ORAL | Status: AC
  Administered 2015-03-18: 10 mg via ORAL
  Filled 2015-03-18: qty 1

## 2015-03-18 NOTE — ED Notes (Signed)
Pt states has taken Tylenol, Aleve, Ibuprofen to aid with pain

## 2015-03-18 NOTE — ED Notes (Signed)
Back and neck pain for 2 weeks. Hx of chronic pain.

## 2015-03-18 NOTE — ED Notes (Signed)
Pt states pain in back at times will increase with leg extensions.

## 2015-03-18 NOTE — Discharge Instructions (Signed)

## 2015-03-18 NOTE — ED Notes (Signed)
Pt presents with lower-mid back pain, states has been on flexril for pain, with min relief now noted, has had back pain for the past year.

## 2015-03-18 NOTE — ED Provider Notes (Signed)
CSN: [id number]     Arrival date & time 03/18/15  1430 History   First MD Initiated Contact with Patient 03/18/15 [phone]     Chief Complaint  Patient presents with  . Back Pain     (Consider location/radiation/quality/duration/timing/severity/associated sxs/prior Treatment) Patient is a 24 y.o. male presenting with back pain.  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 Arthritis   . Allergy   . Anxiety   [facility]   . History of migraine headaches    Past Surgical History  Procedure Laterality Date  . Tonsillectomy     Family History  Problem Relation Age of Onset  . Diabetes Mother   . Hyperlipidemia Mother   . Hypertension Mother   . Cancer Maternal Grandmother   . Diabetes Maternal Grandmother   . Hypertension Maternal Grandmother   . Hypertension Maternal Grandfather   . Hyperlipidemia Maternal Grandfather   . Diabetes Maternal Grandfather   . Cancer Paternal Grandmother   . Cancer Paternal Grandfather    History  Substance Use Topics  . Smoking status: Current Every Day Smoker -- 0.50 packs/day    Types: Cigarettes  . Smokeless tobacco: Never Used  . Alcohol Use: No    Review of Systems   Constitutional: Negative for fever.  HENT: Negative for sore throat.   Eyes: Negative for visual disturbance.  Respiratory: Negative for shortness of breath.   Cardiovascular: Negative for chest pain.  Gastrointestinal: Negative for abdominal pain and bowel incontinence.  Genitourinary: Negative for bladder incontinence, dysuria and difficulty urinating.  Musculoskeletal: Positive for back pain. Negative for neck stiffness.  Skin: Negative for rash.  Neurological: Negative for syncope, weakness, numbness, headaches and paresthesias.      Allergies  Wellbutrin  Home Medications   Prior to Admission medications   Medication Sig Start Date End Date Taking? Authorizing Provider  albuterol (PROVENTIL HFA;VENTOLIN HFA) 108 (90 BASE) MCG/ACT inhaler Inhale 2 puffs into the lungs every 6 (six) hours as needed. 06/27/14   Morrell RiddleSarah L Weber, PA-C  amphetamine-dextroamphetamine (ADDERALL) 10 MG tablet Take 1 tablet (10 mg total) by mouth 2 (two) times daily with a meal. 02/28/15   Morrell RiddleSarah L Weber, PA-C  clonazePAM (KLONOPIN) 0.5 MG tablet Take 0.5-2 tablets (0.25-1 mg total) by mouth 3 (three) times daily as needed for anxiety. 02/28/15   Morrell RiddleSarah L Weber, PA-C  cyclobenzaprine (FLEXERIL) 5 MG tablet TAKE 1 TABLE (5 MG TOTAL) BY MOUTH AT BEDTIME.  "OV NEEDED" 03/16/15   Wallis BambergMario Mani, PA-C  escitalopram (LEXAPRO) 10 MG tablet TAKE 1 TABLET (10 MG TOTAL) BY MOUTH DAILY. 11/01/14   Morrell RiddleSarah L Weber, PA-C  ibuprofen (ADVIL,MOTRIN) 800 MG tablet Take 800 mg by mouth every 8 (eight) hours as needed.  Historical Provider, MD  naproxen (NAPROSYN) 500 MG tablet Take 1 tablet (500 mg total) by mouth 2 (two) times daily. 03/18/15   Alvira Monday, MD  oxyCODONE-acetaminophen (PERCOCET/ROXICET) 5-325 MG per tablet Take 1-2 tablets by mouth every 6 (six) hours as needed for severe pain. 10/31/14   Benjiman Core, MD  tizanidine (ZANAFLEX) 2 MG capsule Take 2 capsules (4 mg total) by mouth 3 (three) times daily. 03/18/15    Alvira Monday, MD  traZODone (DESYREL) 50 MG tablet Take 2 tablets (100 mg total) by mouth at bedtime as needed for sleep. 11/26/14   Morrell Riddle, PA-C   BP 111/68 mmHg  Pulse 58  Temp(Src) 98.3 F (36.8 C) (Oral)  Resp 18  Ht  (1.753 m)  Wt 155 lb (70.308 kg)  BMI 22.88 kg/m2  SpO2 100% Physical Exam  Constitutional: He is oriented to person, place, and time. He appears well-developed and well-nourished. No distress.  HENT:  Head: Normocephalic and atraumatic.  Eyes: Conjunctivae and EOM are normal.  Neck: Normal range of motion.  Cardiovascular: Normal rate, regular rhythm, normal heart sounds and intact distal pulses.  Exam reveals no gallop and no friction rub.   No murmur heard. Pulmonary/Chest: Effort normal and breath sounds normal. No respiratory distress. He has no wheezes. He has no rales.  Abdominal: Soft. He exhibits no distension. There is no tenderness. There is no guarding.  Musculoskeletal: He exhibits no edema.       Cervical back: He exhibits no tenderness and no bony tenderness.       Thoracic back: He exhibits no tenderness and no bony tenderness.       Lumbar back: He exhibits tenderness and bony tenderness.  Neg straight leg raise (increases pain but no shooting)   Neurological: He is alert and oriented to person, place, and time. He has normal strength. No sensory deficit. GCS eye subscore is 4. GCS verbal subscore is 5. GCS motor subscore is 6.  Skin: Skin is warm and dry. He is not diaphoretic.  Nursing note and vitals reviewed.   ED Course  Procedures (including critical care time) Labs Review Labs Reviewed - No data to display  Imaging Review No results found.   EKG Interpretation None      MDM   Final diagnoses:  Right-sided low back pain without sciatica   23yo male with no significant medical history presents with concern of lower back pain.  Patient has a normal neurologic exam and denies any urinary retention or overflow  incontinence, stool incontinence, saddle anesthesia, fever, IV drug use, trauma, chronic steroid use or immunocompromise and have low suspicion suspicion for cauda equina, fracture, epidural abscess, or vertebral osteomyelitis.  Taking flexeril at home without relief. Given dilaudid/toradol in ED and rx for naproxen and xanaflex to replace flexeril as outpt.  Patient discharged in stable condition with understanding of reasons to return.     Alvira Monday, MD 03/19/15 1322

## 2015-03-29 ENCOUNTER — Encounter: Payer: Self-pay | Admitting: Physician Assistant

## 2015-03-31 ENCOUNTER — Other Ambulatory Visit: Payer: Self-pay | Admitting: Physician Assistant

## 2015-03-31 DIAGNOSIS — G4700 Insomnia, unspecified: Secondary | ICD-10-CM

## 2015-03-31 DIAGNOSIS — R41840 Attention and concentration deficit: Secondary | ICD-10-CM

## 2015-04-01 ENCOUNTER — Telehealth: Payer: Self-pay

## 2015-04-01 DIAGNOSIS — G4700 Insomnia, unspecified: Secondary | ICD-10-CM

## 2015-04-01 DIAGNOSIS — R41840 Attention and concentration deficit: Secondary | ICD-10-CM

## 2015-04-01 MED ORDER — CLONAZEPAM 0.5 MG PO TABS: 0.2500 mg | ORAL_TABLET | Freq: Three times a day (TID) | ORAL | Status: DC | PRN

## 2015-04-01 MED ORDER — AMPHETAMINE-DEXTROAMPHETAMINE 10 MG PO TABS: 10.0000 mg | ORAL_TABLET | Freq: Two times a day (BID) | ORAL | Status: DC

## 2015-04-01 NOTE — Telephone Encounter (Signed)
Rxs printed.  Meds ordered this encounter  Medications  . amphetamine-dextroamphetamine (ADDERALL) 10 MG tablet    Sig: Take 1 tablet (10 mg total) by mouth 2 (two) times daily with a meal.    Dispense:  60 tablet    Refill:  0    Order Specific Question:  Supervising Provider    Answer:  DOOLITTLE, ROBERT P [[id number]]  . clonazePAM (KLONOPIN) 0.5 MG tablet    Sig: Take 0.5-2 tablets (0.25-1 mg total) by mouth 3 (three) times daily as needed for anxiety.    Dispense:  30 tablet    Refill:  0    Order Specific Question:  Supervising Provider    Answer:  DOOLITTLE, ROBERT P [[id number]]

## 2015-04-01 NOTE — Telephone Encounter (Signed)
Patient needs a med refill for the Adderall & Klonopin. Please call him when it's ready for pick up.

## 2015-04-04 NOTE — Telephone Encounter (Signed)
Rx's in pickup draw. Left message letting pt know.

## 2015-04-13 ENCOUNTER — Other Ambulatory Visit: Payer: Self-pay | Admitting: Physician Assistant

## 2015-04-14 ENCOUNTER — Other Ambulatory Visit: Payer: Self-pay | Admitting: Urgent Care

## 2015-05-02 ENCOUNTER — Other Ambulatory Visit: Payer: Self-pay | Admitting: Physician Assistant

## 2015-05-02 DIAGNOSIS — G4700 Insomnia, unspecified: Secondary | ICD-10-CM

## 2015-05-02 DIAGNOSIS — R41840 Attention and concentration deficit: Secondary | ICD-10-CM

## 2015-05-10 MED ORDER — CLONAZEPAM 0.5 MG PO TABS: 0.2500 mg | ORAL_TABLET | Freq: Three times a day (TID) | ORAL | Status: DC | PRN

## 2015-05-10 MED ORDER — AMPHETAMINE-DEXTROAMPHETAMINE 10 MG PO TABS: 10.0000 mg | ORAL_TABLET | Freq: Two times a day (BID) | ORAL | Status: DC

## 2015-05-10 NOTE — Addendum Note (Signed)
Addended by: Morrell Riddle on: 05/10/2015 09:10 AM   Modules accepted: Orders

## 2015-05-10 NOTE — Telephone Encounter (Signed)
Ready - pt knows through mychart. 

## 2015-05-14 ENCOUNTER — Other Ambulatory Visit: Payer: Self-pay | Admitting: Physician Assistant

## 2015-05-15 ENCOUNTER — Ambulatory Visit (INDEPENDENT_AMBULATORY_CARE_PROVIDER_SITE_OTHER): Payer: BLUE CROSS/BLUE SHIELD

## 2015-05-15 ENCOUNTER — Ambulatory Visit (INDEPENDENT_AMBULATORY_CARE_PROVIDER_SITE_OTHER): Payer: BLUE CROSS/BLUE SHIELD | Admitting: Physician Assistant

## 2015-05-15 VITALS — BP 122/68 | HR 89 | Temp 98.1°F | Resp 18 | Ht 69.0 in | Wt 167.0 lb

## 2015-05-15 DIAGNOSIS — M545 Low back pain, unspecified: Secondary | ICD-10-CM

## 2015-05-15 DIAGNOSIS — R51 Headache: Secondary | ICD-10-CM | POA: Diagnosis not present

## 2015-05-15 DIAGNOSIS — G4700 Insomnia, unspecified: Secondary | ICD-10-CM

## 2015-05-15 DIAGNOSIS — M25532 Pain in left wrist: Secondary | ICD-10-CM | POA: Diagnosis not present

## 2015-05-15 DIAGNOSIS — F411 Generalized anxiety disorder: Secondary | ICD-10-CM

## 2015-05-15 DIAGNOSIS — M25531 Pain in right wrist: Secondary | ICD-10-CM

## 2015-05-15 DIAGNOSIS — F988 Other specified behavioral and emotional disorders with onset usually occurring in childhood and adolescence: Secondary | ICD-10-CM

## 2015-05-15 DIAGNOSIS — R519 Headache, unspecified: Secondary | ICD-10-CM

## 2015-05-15 DIAGNOSIS — F909 Attention-deficit hyperactivity disorder, unspecified type: Secondary | ICD-10-CM | POA: Diagnosis not present

## 2015-05-15 DIAGNOSIS — G8929 Other chronic pain: Secondary | ICD-10-CM | POA: Insufficient documentation

## 2015-05-15 MED ORDER — ESCITALOPRAM OXALATE 20 MG PO TABS: 20.0000 mg | ORAL_TABLET | Freq: Every day | ORAL | Status: DC

## 2015-05-15 MED ORDER — CYCLOBENZAPRINE HCL ER 15 MG PO CP24: 15.0000 mg | ORAL_CAPSULE | Freq: Every day | ORAL | Status: DC | PRN

## 2015-05-15 MED ORDER — DICLOFENAC SODIUM 75 MG PO TBEC: 75.0000 mg | DELAYED_RELEASE_TABLET | Freq: Two times a day (BID) | ORAL | Status: DC

## 2015-05-15 NOTE — Patient Instructions (Signed)
Call make an appt for chiropractic care for your neck and back.  Recheck in 3 months

## 2015-05-15 NOTE — Progress Notes (Signed)
Joseph Cordova  MRN: [id number] DOB: 1990-12-31  Subjective:  Pt presents to clinic for recheck. 1- ADD - doing well on Adderall bid - only takes during the week but really helps his concentration 2- Anxiety - seems to be getting a little more irritable lately - fiance thinks his anxiety is not as well controlled as it used to be 3- Insomnia - still a problem - uses Klonopin most nights but still not feeling like he sleeps well.  Back pain he thinks is a big part of him not sleeping well and he feels like when he sleeps better when he takes the flexeril at night but he cannot take it during the day because he wants to be sharp and have no drowsiness. Knows part of his sleeping problem is getting to sleep because he cannot turn off his brain Take Motrin  bid and that helps only slightly with his back and neck pain - 4- Headaches and neck pain - about the same - wakes up many mornings with a dull headache - behind his eyes and like a band around his head -  5- chronic low back pain - has it every day all day - seems to be worse at night - across low back at waist line - when it gets really bad he has had a few times where he has pain radiate into his right quad but that has only been once - he typically has no pain radiation or paresthesias. 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  He has tried wrist splint but he cannot work in them and most of this pain is related to working hours  Patient Active Problem List   Diagnosis Date Noted  . Bilateral low back pain without sciatica 05/15/2015  . Chronic headaches  05/15/2015  . ADD (attention deficit disorder) 10/03/2014  . Generalized anxiety disorder 10/03/2014  . Insomnia 10/03/2014    Current Outpatient Prescriptions on File Prior to Visit  Medication Sig Dispense Refill  . albuterol (PROVENTIL HFA;VENTOLIN HFA) 108 (90 BASE) MCG/ACT inhaler Inhale 2 puffs into the lungs every 6 (six) hours as needed. 18 g 0  . amphetamine-dextroamphetamine (ADDERALL) 10 MG tablet Take 1 tablet (10 mg total) by mouth 2 (two) times daily with a meal. 60 tablet 0  . clonazePAM (KLONOPIN) 0.5 MG tablet Take 0.5-2 tablets (0.25-1 mg total) by mouth 3 (three) times daily as needed for anxiety. 30 tablet 0  . cyclobenzaprine (FLEXERIL) 5 MG tablet TAKE 1 TABLE (5 MG TOTAL) BY MOUTH AT BEDTIME. "OV NEEDED" 30 tablet 0  . escitalopram (LEXAPRO) 10 MG tablet Take 1 tablet (10 mg total) by mouth daily. PATIENT NEEDS AN OFFICE VISIT FOR ADDITIONAL REFILLS. 30 tablet 0  . ibuprofen (ADVIL,MOTRIN) 800 MG tablet Take 800 mg by mouth every 8 (eight) hours as needed.     No current facility-administered medications on file prior to visit.    Allergies  Allergen Reactions  . Wellbutrin [Bupropion]     angry  Review of Systems  Musculoskeletal: Positive for back pain and neck pain.  Neurological: Positive for headaches.  Psychiatric/Behavioral: Positive for sleep disturbance. Negative for dysphoric mood. The patient is nervous/anxious.    Objective:  BP 122/68 mmHg  Pulse 89  Temp(Src) 98.1 F (36.7 C) (Oral)  Resp 18  Ht 5\' 9"  (1.753 m)  Wt 167 lb (75.751 kg)  BMI 24.65 kg/m2  SpO2 99%  Physical Exam  Constitutional: He is oriented to person, place, and time and well-developed, well-nourished, and in no distress.  HENT:  Head: Normocephalic and atraumatic.  Right Ear: External ear normal.  Left Ear: External ear normal.  Eyes: Conjunctivae are normal.  Neck: Normal range of motion.  Pulmonary/Chest: Effort normal.  Musculoskeletal:       Right wrist: He  exhibits normal range of motion and no tenderness.       Left wrist: He exhibits normal range of motion and no tenderness.       Cervical back: He exhibits tenderness (trapezius area) and spasm. He exhibits normal range of motion and no bony tenderness.       Lumbar back: He exhibits tenderness (paraspinal lumbar muscles). He exhibits normal range of motion, no bony tenderness and no spasm.  Phalens - pt unwilling to do because he "knows it is going to hurt to much"  Tinels -     Right - tingling to the middle finger    Left - tingling to the thumb  Neurological: He is alert and oriented to person, place, and time. He has normal sensation, normal strength and normal reflexes. Gait normal. Gait normal.  Skin: Skin is warm and dry.  Psychiatric: Mood, memory, affect and judgment normal.   [facility] reading (PRIMARY) by  Dr. Patsy Lager.  Increased stool burden, no abnl bony findings  Assessment and Plan :  Bilateral low back pain without sciatica - Plan: DG Lumbar Spine 2-3 Views, cyclobenzaprine (AMRIX) 15 MG 24 hr capsule, diclofenac (VOLTAREN) 75 MG EC tablet  ADD (attention deficit disorder) - continue current medications - ok to refill for the next 3 months  Generalized anxiety disorder - Plan: escitalopram (LEXAPRO) 20 MG tablet - increase Lexapro to see if irritability improves - recheck in about 3 months to give it time to work  Insomnia - for now continue Klonopin prn - he is using most nights but I would really like to have his anxiety controlled better so he is able to sleep without this medication - I hope that more aggressively treating his back will help his the insomnia related to back pain and discomfort while laying down.  Chronic nonintractable headache, unspecified headache type - normal xray and norma, neurological exam - we will try Amrix in hopes that his muscle spasms will be controlled during the day while he is working so that he is in less pain when he tries to sleep -    Bilateral wrist pain - concern for possible carpal tunnel but it is not classic presentation and with his chronic neck pain it is possible that this is related.  The NSAIDs should help with this.  We will see if he can be more aware of the type of pain and the location of the pain and paresthesias and then if no better we will send to ortho for possible NCS to help determine the carpal tunnel possibility.   Benny Lennert PA-C  [facility] 05/15/2015 2:17 PM

## 2015-06-03 ENCOUNTER — Telehealth: Payer: Self-pay | Admitting: Physician Assistant

## 2015-06-03 DIAGNOSIS — R41840 Attention and concentration deficit: Secondary | ICD-10-CM

## 2015-06-03 DIAGNOSIS — G4700 Insomnia, unspecified: Secondary | ICD-10-CM

## 2015-06-03 MED ORDER — CLONAZEPAM 0.5 MG PO TABS: 0.2500 mg | ORAL_TABLET | Freq: Three times a day (TID) | ORAL | Status: DC | PRN

## 2015-06-03 MED ORDER — AMPHETAMINE-DEXTROAMPHETAMINE 10 MG PO TABS
10.0000 mg | ORAL_TABLET | Freq: Two times a day (BID) | ORAL | Status: DC
Start: 2015-06-03 — End: 2015-07-05

## 2015-06-03 MED ORDER — CYCLOBENZAPRINE HCL 5 MG PO TABS: 5.0000 mg | ORAL_TABLET | Freq: Every day | ORAL | Status: DC

## 2015-06-03 NOTE — Addendum Note (Signed)
Addended by: Morrell Riddle on: 06/03/2015 07:30 PM   Modules accepted: Orders

## 2015-06-03 NOTE — Telephone Encounter (Signed)
Done - pt knows through my chart 

## 2015-06-15 ENCOUNTER — Other Ambulatory Visit: Payer: Self-pay | Admitting: Physician Assistant

## 2015-06-19 NOTE — Addendum Note (Signed)
Addended by: Fernande BrasJEFFERY, Leylani Duley S on: 06/19/2015 08:06 AM   Modules accepted: Orders

## 2015-07-04 ENCOUNTER — Telehealth: Payer: Self-pay | Admitting: Physician Assistant

## 2015-07-04 DIAGNOSIS — R41840 Attention and concentration deficit: Secondary | ICD-10-CM

## 2015-07-04 DIAGNOSIS — G4700 Insomnia, unspecified: Secondary | ICD-10-CM

## 2015-07-05 MED ORDER — CLONAZEPAM 0.5 MG PO TABS: 0.2500 mg | ORAL_TABLET | Freq: Three times a day (TID) | ORAL | Status: DC | PRN

## 2015-07-05 MED ORDER — AMPHETAMINE-DEXTROAMPHETAMINE 10 MG PO TABS: 10.0000 mg | ORAL_TABLET | Freq: Two times a day (BID) | ORAL | Status: DC

## 2015-07-05 NOTE — Addendum Note (Signed)
Addended by: Morrell RiddleWEBER, SARAH L on: 07/05/2015 12:03 PM   Modules accepted: Orders

## 2015-07-05 NOTE — Telephone Encounter (Signed)
Ready - pt knows through mychart. 

## 2015-07-05 NOTE — Telephone Encounter (Signed)
Joseph Cordova had not signed the printed Rx for clonazepam so I voided the printed copy attached to the Adderall Rx and called in the clonazepam to pharm. Advised pt on MyChart.

## 2015-07-10 ENCOUNTER — Other Ambulatory Visit: Payer: Self-pay | Admitting: Physician Assistant

## 2015-07-18 ENCOUNTER — Encounter: Payer: Self-pay | Admitting: Physician Assistant

## 2015-07-20 ENCOUNTER — Ambulatory Visit (INDEPENDENT_AMBULATORY_CARE_PROVIDER_SITE_OTHER): Payer: BLUE CROSS/BLUE SHIELD | Admitting: Family Medicine

## 2015-07-20 ENCOUNTER — Ambulatory Visit (INDEPENDENT_AMBULATORY_CARE_PROVIDER_SITE_OTHER): Payer: BLUE CROSS/BLUE SHIELD

## 2015-07-20 VITALS — BP 110/70 | HR 96 | Temp 98.5°F | Resp 15 | Ht 69.0 in | Wt 164.0 lb

## 2015-07-20 DIAGNOSIS — R110 Nausea: Secondary | ICD-10-CM

## 2015-07-20 DIAGNOSIS — M25561 Pain in right knee: Secondary | ICD-10-CM

## 2015-07-20 DIAGNOSIS — M25461 Effusion, right knee: Secondary | ICD-10-CM

## 2015-07-20 LAB — POCT CBC
GRANULOCYTE PERCENT: 66.3 % (ref 37–80)
HEMATOCRIT: 41.4 % — AB (ref 43.5–53.7)
HEMOGLOBIN: 14.1 g/dL (ref 14.1–18.1)
LYMPH, POC: 2.8 (ref 0.6–3.4)
MCH: 30.8 pg (ref 27–31.2)
MCHC: 34.1 g/dL (ref 31.8–35.4)
MCV: 90.4 fL (ref 80–97)
MID (cbc): 0.8 (ref 0–0.9)
MPV: 6.2 fL (ref 0–99.8)
POC GRANULOCYTE: 7.2 — AB (ref 2–6.9)
POC LYMPH PERCENT: 26.2 %L (ref 10–50)
POC MID %: 7.5 % (ref 0–12)
Platelet Count, POC: 333 10*3/uL (ref 142–424)
RBC: 4.58 M/uL — AB (ref 4.69–6.13)
RDW, POC: 12.7 %
WBC: 10.8 10*3/uL — AB (ref 4.6–10.2)

## 2015-07-20 LAB — POCT SEDIMENTATION RATE: POCT SED RATE: 11 mm/h (ref 0–22)

## 2015-07-20 MED ORDER — CEPHALEXIN 500 MG PO CAPS: 500.0000 mg | ORAL_CAPSULE | Freq: Four times a day (QID) | ORAL | Status: DC

## 2015-07-20 MED ORDER — DOXYCYCLINE HYCLATE 100 MG PO CAPS: 100.0000 mg | ORAL_CAPSULE | Freq: Two times a day (BID) | ORAL | Status: DC

## 2015-07-20 MED ORDER — CEFTRIAXONE SODIUM 1 G IJ SOLR
1.0000 g | Freq: Once | INTRAMUSCULAR | Status: AC
  Administered 2015-07-20: 1 g via INTRAMUSCULAR

## 2015-07-20 MED ORDER — DOXYCYCLINE HYCLATE 50 MG PO CAPS: 50.0000 mg | ORAL_CAPSULE | Freq: Two times a day (BID) | ORAL | Status: DC

## 2015-07-20 MED ORDER — ONDANSETRON 4 MG PO TBDP
8.0000 mg | ORAL_TABLET | Freq: Once | ORAL | Status: AC
  Administered 2015-07-20: 8 mg via ORAL

## 2015-07-20 MED ORDER — HYDROCODONE-ACETAMINOPHEN 5-325 MG PO TABS: 1.0000 | ORAL_TABLET | Freq: Four times a day (QID) | ORAL | Status: DC | PRN

## 2015-07-20 NOTE — Progress Notes (Signed)
Subjective:    Patient ID: Joseph Cordova, male    DOB: 18-Dec-1990, 24 y.o.   MRN: [id number]  07/20/2015  Knee Injury   HPI This 24 y.o. male presents for evaluation of R  Knee pain. Has been crawling under a house; noticed that knee started hurting; trying to get it to heel self; icing it.  Kept progressively worsening; hitting it hurts; cannot lay on stomach at night.  Crawling under the house; does not recall true injury or trauma to knee. Walking and any movement is painful.  Severe pain.  Puts in terrible pain.  Could not tolerate pain.  Girlgriend works at [employer]  Hurts to lift leg up to get into truck.  Girlfriend has knee pain which was painful.  No fever/chills/sweats.  R knee is warm; no drainage from knee.  Redness.  Significant swelling onset one week ago.  No previous injury to R knee in past; did play sports in high school yet no major trauma. No personal history of gout.  Grandmother with gout. Went into [facility] for R wrist cyst.    Review of Systems  Constitutional: Negative for fever, chills, diaphoresis, activity change, appetite change and fatigue.  Eyes: Negative for visual disturbance.  Respiratory: Negative for cough and shortness of breath.   Cardiovascular: Negative for chest pain, palpitations and leg swelling.  Endocrine: Negative for cold intolerance, heat intolerance, polydipsia, polyphagia and polyuria.  Musculoskeletal: Positive for joint swelling, arthralgias and gait problem. Negative for myalgias, back pain, neck pain and neck stiffness.  Skin: Positive for color change. Negative for pallor, rash and wound.  Neurological: Negative for dizziness, tremors, seizures, syncope, facial asymmetry, speech difficulty, weakness, light-headedness, numbness and headaches.    Past Medical History  Diagnosis Date  . Arthritis   . Allergy   . Anxiety   . [facility] spotted fever   . History of migraine headaches    Past Surgical History  Procedure  Laterality Date  . Tonsillectomy     Allergies  Allergen Reactions  . Wellbutrin [Bupropion]     angry    Social History   Social History  . Marital Status: Single    Spouse Name: N/A  . Number of Children: N/A  . Years of Education: N/A   Occupational History  . Not on file.   Social History Main Topics  . Smoking status: Current Every Day Smoker -- 0.50 packs/day    Types: Cigarettes  . Smokeless tobacco: Never Used  . Alcohol Use: No  . Drug Use: No  . Sexual Activity: No   Other Topics Concern  . Not on file   Social History Narrative   Heating and cooling.   Engaged   Lives with parents   Family History  Problem Relation Age of Onset  . Diabetes Mother   . Hyperlipidemia Mother   . Hypertension Mother   . Cancer Maternal Grandmother   . Diabetes Maternal Grandmother   . Hypertension Maternal Grandmother   . Hypertension Maternal Grandfather   . Hyperlipidemia Maternal Grandfather   . Diabetes Maternal Grandfather   . Cancer Paternal Grandmother   . Cancer Paternal Grandfather        Objective:    BP 110/70 mmHg  Pulse 96  Temp(Src) 98.5 F (36.9 C) (Oral)  Resp 15  Ht  (1.753 m)  Wt 164 lb (74.39 kg)  BMI 24.21 kg/m2  SpO2 98% Physical Exam  Constitutional: He is oriented to person, place, and time.  He appears well-developed and well-nourished. No distress.  HENT:  Head: Normocephalic and atraumatic.  Right Ear: External ear normal.  Left Ear: External ear normal.  Nose: Nose normal.  Mouth/Throat: Oropharynx is clear and moist.  Eyes: Conjunctivae and EOM are normal. Pupils are equal, round, and reactive to light.  Neck: Normal range of motion. Neck supple. Carotid bruit is not present.  Cardiovascular: Intact distal pulses.   Pulmonary/Chest: Effort normal and breath sounds normal.  Abdominal: Soft. Bowel sounds are normal.  Musculoskeletal:       Right knee: He exhibits decreased range of motion, swelling, effusion and  erythema. He exhibits no ecchymosis, no deformity, no laceration, normal alignment, normal patellar mobility and no bony tenderness. Tenderness found. Medial joint line and lateral joint line tenderness noted. No MCL, no LCL and no patellar tendon tenderness noted.       Right ankle: Normal. He exhibits normal range of motion, no swelling, no ecchymosis and no deformity. No tenderness.       Right upper leg: Normal. He exhibits no tenderness, no bony tenderness, no swelling and no edema.       Right lower leg: Normal. He exhibits no tenderness, no bony tenderness, no swelling, no edema, no deformity and no laceration.       Legs: R KNEE: diffuse swelling of knee with prominence of swelling along lateral superior aspect.  Two superficial abrasions along lateral aspect of inferior knee.  No pustules; no active drainage.  +diffuse TTP of knee; decreased ROM of knee yet able to flex and extend; able to bear weight yet with limp with ambulation.  Neurological: He is alert and oriented to person, place, and time. No cranial nerve deficit.  Skin: Skin is warm and dry. No rash noted. He is not diaphoretic. There is erythema.  Mild erythema of knee diffusely especially along lateral aspect of knee R.  No streaking.    Psychiatric: He has a normal mood and affect. His behavior is normal.  Nursing note and vitals reviewed.  Results for orders placed or performed in visit on 07/20/15  POCT CBC  Result Value Ref Range   WBC 10.8 (A) 4.6 - 10.2 K/uL   Lymph, poc 2.8 0.6 - 3.4   POC LYMPH PERCENT 26.2 10 - 50 %L   MID (cbc) 0.8 0 - 0.9   POC MID % 7.5 0 - 12 %M   POC Granulocyte 7.2 (A) 2 - 6.9   Granulocyte percent 66.3 37 - 80 %G   RBC 4.58 (A) 4.69 - 6.13 M/uL   Hemoglobin 14.1 14.1 - 18.1 g/dL   HCT, POC 69.641.4 (A) 29.543.5 - 53.7 %   MCV 90.4 80 - 97 fL   MCH, POC 30.8 27 - 31.2 pg   MCHC 34.1 31.8 - 35.4 g/dL   RDW, POC 28.412.7 %   Platelet Count, POC 333 142 - 424 K/uL   MPV 6.2 0 - 99.8 fL  POCT  SEDIMENTATION RATE  Result Value Ref Range   POCT SED RATE 11 0 - 22 mm/hr   [facility] reading (PRIMARY) by  Dr. Katrinka BlazingSmith.  R KNEE FILMS: NO ACUTE DISEASE.    PROCEDURE NOTE:  VERBAL CONSENT OBTAINED; CLEANSED WITH ALCOHOL AND BETADINE; 20 GAUGE NEEDLE ADVANCED INTO MEDIAL INFERIOR ASPECT OF KNEE; NO FLUID ASPIRATED. GOOD HEMOSTASIS. BANDAGE APPLIED. PROCEDURE COMPLETED BY DR. HESS/SM FELLOW.  ATTENDING MD PRESENT FOR ENTIRE PROCEDURE.  ROCEPHIN 1 GRAM ADMINISTERED.  ZOFRAN 0DT 8MG  PROVIDED DUE TO NAUSEA POST-PROCEDURE  AND POST-ROCEPHIN INJECTION.  BACTRIM DS 2 TABLETS PROVIDED AT DISCHARGE DUE TO LATE HOUR OF DISCHARGE; UNABLE TO OBTAIN ORAL ABX UNTIL A.M.    Assessment & Plan:   1. Knee swelling, right   2. Pain in right knee   3. Nausea    -New. -Concern for infectious etiology to symptoms yet no current fever or complete intolerance to weight bearing.  No fluid from knee tonight; thus, treated empirically with Rocephin, Bactrim in office.   -STAT ortho referral in upcoming 24-48 hours for reevaluation.  To ED for development of fever or worsening symptoms. -rx for Doxycycline and Keflex provided. -rx for hydrocodone also provided.   Orders Placed This Encounter  Procedures  . DG Knee Complete 4 Views Right    Standing Status: Future     Number of Occurrences: 1     Standing Expiration Date: 07/19/2016    Order Specific Question:  Reason for Exam (SYMPTOM  OR DIAGNOSIS REQUIRED)    Answer:  R knee pain and swelling and warmth and redness.    Order Specific Question:  Preferred imaging location?    Answer:  External  . Ambulatory referral to Orthopedic Surgery    Referral Priority:  Urgent    Referral Type:  Surgical    Referral Reason:  Specialty Services Required    Requested Specialty:  Orthopedic Surgery    Number of Visits Requested:  1  . POCT CBC  . POCT SEDIMENTATION RATE   Meds ordered this encounter  Medications  . DISCONTD: doxycycline (VIBRAMYCIN) 50 MG capsule      Sig: Take 1 capsule (50 mg total) by mouth 2 (two) times daily.    Dispense:  20 capsule    Refill:  0  . doxycycline (VIBRAMYCIN) 100 MG capsule    Sig: Take 1 capsule (100 mg total) by mouth 2 (two) times daily.    Dispense:  20 capsule    Refill:  0  . cefTRIAXone (ROCEPHIN) injection 1 g    Sig:   . HYDROcodone-acetaminophen (NORCO/VICODIN) 5-325 MG tablet    Sig: Take 1-2 tablets by mouth every 6 (six) hours as needed for moderate pain.    Dispense:  40 tablet    Refill:  0  . ondansetron (ZOFRAN-ODT) disintegrating tablet 8 mg    Sig:   . cephALEXin (KEFLEX) 500 MG capsule    Sig: Take 1 capsule (500 mg total) by mouth 4 (four) times daily.    Dispense:  40 capsule    Refill:  0    No Follow-up on file.   Anthon Harpole Paulita Fujita, M.D. [facility] [address] [place], [place]  [address] [phone] phone [phone] fax

## 2015-07-20 NOTE — Patient Instructions (Signed)
1. PRESENT TO EMERGENCY DEPARTMENT FOR INCREASING PAIN, SWELLING, REDNESS, OR FEVER > 101. 0.

## 2015-07-21 ENCOUNTER — Telehealth: Payer: Self-pay

## 2015-07-21 NOTE — Telephone Encounter (Signed)
Pharmacy called regarding Doxycycline and Keflex. They wanted to know if both antibiotics were to be filled or only one of them?  Pharmacy call back number: [phone]

## 2015-07-21 NOTE — Telephone Encounter (Signed)
Doxycycline 100mg  (not 50mg ) and Keflex are to both be filled.

## 2015-07-21 NOTE — Telephone Encounter (Signed)
Spoke with pharmacy, advised to fill both medications.

## 2015-08-02 ENCOUNTER — Encounter: Payer: Self-pay | Admitting: Physician Assistant

## 2015-08-08 ENCOUNTER — Other Ambulatory Visit: Payer: Self-pay | Admitting: Physician Assistant

## 2015-08-08 DIAGNOSIS — R41840 Attention and concentration deficit: Secondary | ICD-10-CM

## 2015-08-08 DIAGNOSIS — G4700 Insomnia, unspecified: Secondary | ICD-10-CM

## 2015-08-11 MED ORDER — ESCITALOPRAM OXALATE 10 MG PO TABS: 10.0000 mg | ORAL_TABLET | Freq: Every day | ORAL | Status: DC

## 2015-08-11 MED ORDER — AMPHETAMINE-DEXTROAMPHETAMINE 10 MG PO TABS: 10.0000 mg | ORAL_TABLET | Freq: Two times a day (BID) | ORAL | Status: DC

## 2015-08-11 MED ORDER — CLONAZEPAM 0.5 MG PO TABS: 0.2500 mg | ORAL_TABLET | Freq: Three times a day (TID) | ORAL | Status: DC | PRN

## 2015-08-11 NOTE — Addendum Note (Signed)
Addended by: Morrell RiddleWEBER, SARAH L on: 08/11/2015 03:41 PM   Modules accepted: Orders

## 2015-08-11 NOTE — Telephone Encounter (Signed)
Done patient knows though mychart

## 2015-08-11 NOTE — Telephone Encounter (Signed)
Pt came to pick this up and Clerical TL was calling to [phone] to get Rx for pt.

## 2015-08-18 ENCOUNTER — Other Ambulatory Visit: Payer: Self-pay | Admitting: Physician Assistant

## 2015-08-31 ENCOUNTER — Other Ambulatory Visit: Payer: Self-pay | Admitting: Physician Assistant

## 2015-08-31 DIAGNOSIS — M545 Low back pain, unspecified: Secondary | ICD-10-CM

## 2015-09-01 MED ORDER — DICLOFENAC SODIUM 75 MG PO TBEC: 75.0000 mg | DELAYED_RELEASE_TABLET | Freq: Two times a day (BID) | ORAL | Status: DC

## 2015-09-01 MED ORDER — ESCITALOPRAM OXALATE 20 MG PO TABS: 20.0000 mg | ORAL_TABLET | Freq: Every day | ORAL | Status: DC

## 2015-09-01 MED ORDER — CYCLOBENZAPRINE HCL 5 MG PO TABS: ORAL_TABLET | ORAL | Status: DC

## 2015-09-01 NOTE — Addendum Note (Signed)
Addended by: Raylin Winer S on: 09/01/2015 03:07 PM   Modules accepted: Orders  

## 2015-09-01 NOTE — Telephone Encounter (Signed)
Patient notified via My Chart.  Meds ordered this encounter  Medications  . escitalopram (LEXAPRO) 20 MG tablet    Sig: Take 1 tablet (20 mg total) by mouth daily.    Dispense:  30 tablet    Refill:  5    Order Specific Question:  Supervising Provider    Answer:  DOOLITTLE, ROBERT P [[id number]]  . diclofenac (VOLTAREN) 75 MG EC tablet    Sig: Take 1 tablet (75 mg total) by mouth 2 (two) times daily.    Dispense:  60 tablet    Refill:  2    Order Specific Question:  Supervising Provider    Answer:  DOOLITTLE, ROBERT P [[id number]]  . cyclobenzaprine (FLEXERIL) 5 MG tablet    Sig: TAKE 1 TABLET (5 MG TOTAL) BY MOUTH AT BEDTIME    Dispense:  30 tablet    Refill:  0    Order Specific Question:  Supervising Provider    Answer:  DOOLITTLE, ROBERT P [[id number]]

## 2015-09-01 NOTE — Addendum Note (Signed)
Addended by: Fernande BrasJEFFERY, Aarav Burgett S on: 09/01/2015 04:48 PM   Modules accepted: Orders

## 2015-09-01 NOTE — Telephone Encounter (Signed)
Patient notified via My Chart.  Meds ordered this encounter  Medications  . escitalopram (LEXAPRO) 20 MG tablet    Sig: Take 1 tablet (20 mg total) by mouth daily.    Dispense:  30 tablet    Refill:  5    Order Specific Question:  Supervising Provider    Answer:  DOOLITTLE, ROBERT P [[id number]]

## 2015-09-05 MED ORDER — CYCLOBENZAPRINE HCL 5 MG PO TABS: ORAL_TABLET | ORAL | Status: DC

## 2015-09-05 MED ORDER — CYCLOBENZAPRINE HCL ER 15 MG PO CP24
15.0000 mg | ORAL_CAPSULE | Freq: Every day | ORAL | Status: DC | PRN
Start: 2015-09-05 — End: 2016-02-09

## 2015-09-05 NOTE — Telephone Encounter (Signed)
Patient notified via My Chart

## 2015-09-05 NOTE — Addendum Note (Signed)
Addended by: Fernande BrasJEFFERY, Ilean Spradlin S on: 09/05/2015 05:24 PM   Modules accepted: Orders

## 2015-09-05 NOTE — Addendum Note (Signed)
Addended by: Fernande BrasJEFFERY, Maynor Mwangi S on: 09/05/2015 05:14 PM   Modules accepted: Orders

## 2015-09-12 ENCOUNTER — Telehealth: Payer: Self-pay | Admitting: Physician Assistant

## 2015-09-12 DIAGNOSIS — R41840 Attention and concentration deficit: Secondary | ICD-10-CM

## 2015-09-12 DIAGNOSIS — G4700 Insomnia, unspecified: Secondary | ICD-10-CM

## 2015-09-13 ENCOUNTER — Other Ambulatory Visit: Payer: Self-pay | Admitting: Physician Assistant

## 2015-09-13 MED ORDER — CLONAZEPAM 0.5 MG PO TABS: 0.2500 mg | ORAL_TABLET | Freq: Three times a day (TID) | ORAL | Status: DC | PRN

## 2015-09-13 MED ORDER — AMPHETAMINE-DEXTROAMPHETAMINE 10 MG PO TABS: 10.0000 mg | ORAL_TABLET | Freq: Two times a day (BID) | ORAL | Status: DC

## 2015-09-13 NOTE — Telephone Encounter (Signed)
Done - pt knows through my chart 

## 2015-09-13 NOTE — Addendum Note (Signed)
Addended by: Morrell RiddleWEBER, Burt Piatek L on: 09/13/2015 12:18 PM   Modules accepted: Orders

## 2015-10-07 ENCOUNTER — Other Ambulatory Visit: Payer: Self-pay | Admitting: Family Medicine

## 2015-10-07 ENCOUNTER — Other Ambulatory Visit: Payer: Self-pay | Admitting: Physician Assistant

## 2015-10-10 ENCOUNTER — Other Ambulatory Visit: Payer: Self-pay | Admitting: Physician Assistant

## 2015-10-10 ENCOUNTER — Other Ambulatory Visit: Payer: Self-pay | Admitting: Family Medicine

## 2015-10-10 ENCOUNTER — Telehealth: Payer: Self-pay

## 2015-10-10 DIAGNOSIS — G4700 Insomnia, unspecified: Secondary | ICD-10-CM

## 2015-10-10 DIAGNOSIS — M545 Low back pain, unspecified: Secondary | ICD-10-CM

## 2015-10-10 DIAGNOSIS — R41840 Attention and concentration deficit: Secondary | ICD-10-CM

## 2015-10-10 MED ORDER — ESCITALOPRAM OXALATE 20 MG PO TABS: 20.0000 mg | ORAL_TABLET | Freq: Every day | ORAL | Status: DC

## 2015-10-10 NOTE — Telephone Encounter (Signed)
Patient called after hours at 6:30pm asking for refill of Lexapro.  Patient reports that he has sent three MyChart messages requesting refill; has been out of medication for four days.  Upon review of chart, six total refills of Lexapro provided on 09/01/15.  Will refill again.  Rx sent in for Lexapro.

## 2015-10-10 NOTE — Telephone Encounter (Signed)
Pts mom states son is totally out of all his meds and is having difficultiies functioning,  Please advise  Best phone for pt is [phone]

## 2015-10-10 NOTE — Telephone Encounter (Signed)
Please get names of medications

## 2015-10-11 ENCOUNTER — Other Ambulatory Visit: Payer: Self-pay | Admitting: Physician Assistant

## 2015-10-11 DIAGNOSIS — R41840 Attention and concentration deficit: Secondary | ICD-10-CM

## 2015-10-11 NOTE — Telephone Encounter (Signed)
Mom called to check the status of rx req.  Flexeril and and some type of antiinflammatory   [phone] MO.[phone] PT

## 2015-10-12 MED ORDER — DICLOFENAC SODIUM 75 MG PO TBEC: 75.0000 mg | DELAYED_RELEASE_TABLET | Freq: Two times a day (BID) | ORAL | Status: DC

## 2015-10-12 MED ORDER — CYCLOBENZAPRINE HCL 5 MG PO TABS: ORAL_TABLET | ORAL | Status: DC

## 2015-10-12 NOTE — Telephone Encounter (Signed)
Pt had also sent a Mychart req for adderall, which I have pended here.

## 2015-10-12 NOTE — Telephone Encounter (Signed)
Mother is not on HIPPA so can not call her back to advise. I am responding to one of pt's MyChart messages.

## 2015-10-13 ENCOUNTER — Other Ambulatory Visit: Payer: Self-pay

## 2015-10-13 DIAGNOSIS — G4700 Insomnia, unspecified: Secondary | ICD-10-CM

## 2015-10-13 DIAGNOSIS — R41840 Attention and concentration deficit: Secondary | ICD-10-CM

## 2015-10-13 NOTE — Telephone Encounter (Signed)
Patient called to see if his refill for adderral and klonipin are ready. I informed patient that the medication was never requested. Please call when ready! [phone]

## 2015-10-16 ENCOUNTER — Other Ambulatory Visit: Payer: Self-pay | Admitting: Physician Assistant

## 2015-10-18 MED ORDER — ESCITALOPRAM OXALATE 20 MG PO TABS: 20.0000 mg | ORAL_TABLET | Freq: Every day | ORAL | Status: DC

## 2015-10-18 MED ORDER — CYCLOBENZAPRINE HCL 5 MG PO TABS: ORAL_TABLET | ORAL | Status: DC

## 2015-10-18 MED ORDER — AMPHETAMINE-DEXTROAMPHETAMINE 10 MG PO TABS: 10.0000 mg | ORAL_TABLET | Freq: Two times a day (BID) | ORAL | Status: DC

## 2015-10-18 MED ORDER — CLONAZEPAM 0.5 MG PO TABS: 0.2500 mg | ORAL_TABLET | Freq: Three times a day (TID) | ORAL | Status: DC | PRN

## 2015-10-18 NOTE — Addendum Note (Signed)
Addended by: Morrell Riddle on: 10/18/2015 12:22 PM   Modules accepted: Orders

## 2015-11-10 ENCOUNTER — Encounter: Payer: Self-pay | Admitting: Physician Assistant

## 2015-11-10 DIAGNOSIS — R41840 Attention and concentration deficit: Secondary | ICD-10-CM

## 2015-11-10 DIAGNOSIS — G4700 Insomnia, unspecified: Secondary | ICD-10-CM

## 2015-11-13 MED ORDER — ESCITALOPRAM OXALATE 20 MG PO TABS: 20.0000 mg | ORAL_TABLET | Freq: Every day | ORAL | Status: DC

## 2015-11-13 MED ORDER — CLONAZEPAM 0.5 MG PO TABS: 0.2500 mg | ORAL_TABLET | Freq: Three times a day (TID) | ORAL | Status: DC | PRN

## 2015-11-13 MED ORDER — AMPHETAMINE-DEXTROAMPHETAMINE 10 MG PO TABS: 10.0000 mg | ORAL_TABLET | Freq: Two times a day (BID) | ORAL | Status: DC

## 2015-11-13 MED ORDER — CYCLOBENZAPRINE HCL 5 MG PO TABS: ORAL_TABLET | ORAL | Status: DC

## 2015-11-13 NOTE — Telephone Encounter (Signed)
Done.  Pt knows through mychart 

## 2015-12-11 ENCOUNTER — Other Ambulatory Visit: Payer: Self-pay | Admitting: Physician Assistant

## 2015-12-11 DIAGNOSIS — R41840 Attention and concentration deficit: Secondary | ICD-10-CM

## 2015-12-11 DIAGNOSIS — G4700 Insomnia, unspecified: Secondary | ICD-10-CM

## 2015-12-13 ENCOUNTER — Other Ambulatory Visit: Payer: Self-pay | Admitting: Physician Assistant

## 2015-12-13 NOTE — Addendum Note (Signed)
Addended by: Morrell RiddleWEBER, SARAH L on: 12/13/2015 09:09 PM   Modules accepted: Orders

## 2015-12-13 NOTE — Telephone Encounter (Signed)
Will someone please print and sign for me.  Thanks

## 2015-12-14 MED ORDER — AMPHETAMINE-DEXTROAMPHETAMINE 10 MG PO TABS: 10.0000 mg | ORAL_TABLET | Freq: Two times a day (BID) | ORAL | Status: DC

## 2015-12-14 MED ORDER — CLONAZEPAM 0.5 MG PO TABS: 0.2500 mg | ORAL_TABLET | Freq: Three times a day (TID) | ORAL | Status: DC | PRN

## 2015-12-14 NOTE — Telephone Encounter (Signed)
Notified pt on VM that Rxs are ready for p/up.

## 2015-12-29 ENCOUNTER — Encounter: Payer: Self-pay | Admitting: Physician Assistant

## 2016-01-09 ENCOUNTER — Telehealth: Payer: Self-pay | Admitting: Physician Assistant

## 2016-01-09 DIAGNOSIS — G4700 Insomnia, unspecified: Secondary | ICD-10-CM

## 2016-01-09 DIAGNOSIS — R41840 Attention and concentration deficit: Secondary | ICD-10-CM

## 2016-01-09 MED ORDER — AMPHETAMINE-DEXTROAMPHETAMINE 10 MG PO TABS: 10.0000 mg | ORAL_TABLET | Freq: Two times a day (BID) | ORAL | Status: DC

## 2016-01-09 MED ORDER — CLONAZEPAM 0.5 MG PO TABS: 0.2500 mg | ORAL_TABLET | Freq: Three times a day (TID) | ORAL | Status: DC | PRN

## 2016-01-09 NOTE — Addendum Note (Signed)
Addended by: Morrell RiddleWEBER, Braylynn Lewing L on: 01/09/2016 12:19 PM   Modules accepted: Orders

## 2016-01-09 NOTE — Telephone Encounter (Signed)
Rx to be put in drawer at 102

## 2016-01-09 NOTE — Telephone Encounter (Signed)
Done. Pt knows through  My chart

## 2016-01-14 ENCOUNTER — Encounter: Payer: Self-pay | Admitting: Physician Assistant

## 2016-02-09 ENCOUNTER — Other Ambulatory Visit: Payer: Self-pay | Admitting: Physician Assistant

## 2016-02-14 ENCOUNTER — Telehealth: Payer: Self-pay | Admitting: Physician Assistant

## 2016-02-14 DIAGNOSIS — G4700 Insomnia, unspecified: Secondary | ICD-10-CM

## 2016-02-14 DIAGNOSIS — R41840 Attention and concentration deficit: Secondary | ICD-10-CM

## 2016-02-17 MED ORDER — CLONAZEPAM 0.5 MG PO TABS: 0.2500 mg | ORAL_TABLET | Freq: Three times a day (TID) | ORAL | Status: DC | PRN

## 2016-02-17 MED ORDER — AMPHETAMINE-DEXTROAMPHETAMINE 10 MG PO TABS: 10.0000 mg | ORAL_TABLET | Freq: Two times a day (BID) | ORAL | Status: DC

## 2016-02-17 NOTE — Telephone Encounter (Signed)
Done - patient knows through mychart 

## 2016-02-17 NOTE — Telephone Encounter (Signed)
Rx in drawer. 

## 2016-03-21 ENCOUNTER — Encounter: Payer: Self-pay | Admitting: Physician Assistant

## 2016-03-21 ENCOUNTER — Ambulatory Visit (INDEPENDENT_AMBULATORY_CARE_PROVIDER_SITE_OTHER): Payer: Self-pay | Admitting: Physician Assistant

## 2016-03-21 VITALS — BP 108/68 | HR 59 | Temp 98.8°F | Ht 67.8 in | Wt 159.0 lb

## 2016-03-21 DIAGNOSIS — F988 Other specified behavioral and emotional disorders with onset usually occurring in childhood and adolescence: Secondary | ICD-10-CM

## 2016-03-21 DIAGNOSIS — G4700 Insomnia, unspecified: Secondary | ICD-10-CM

## 2016-03-21 DIAGNOSIS — F909 Attention-deficit hyperactivity disorder, unspecified type: Secondary | ICD-10-CM

## 2016-03-21 MED ORDER — CLONAZEPAM 0.5 MG PO TABS: 0.2500 mg | ORAL_TABLET | Freq: Every evening | ORAL | Status: DC | PRN

## 2016-03-21 MED ORDER — AMPHETAMINE-DEXTROAMPHETAMINE 10 MG PO TABS: 10.0000 mg | ORAL_TABLET | Freq: Two times a day (BID) | ORAL | Status: DC

## 2016-03-21 NOTE — Progress Notes (Signed)
   Joseph Cordova  MRN: [id number] DOB: 1990-12-13  Subjective:  Pt presents to clinic for medication refill.  He is doing well on his Adderall - he takes a dose every morning and the afternoon dose depends on his work load.  He has a new job which he really likes as he does not have to deal with the public.  He feels like his anxiety is much better now that his job has changed - he would like to go off the lexapro - Lexapro 10mg  for the last 3 days and he has tolerated it fine.  He uses about 1/2 Klonopin at night to help him get to sleep and then once he is asleep he rests well.  He still uses the Flexeril for his back pain at night.  He has his wife are currently separated and he is dealing ok with that.  Review of Systems  Constitutional: Negative for fever and chills.  Psychiatric/Behavioral: Positive for sleep disturbance and decreased concentration (good with the adderall - not good when he runs out). The patient is not nervous/anxious.     Patient Active Problem List   Diagnosis Date Noted  . Bilateral low back pain without sciatica 05/15/2015  . Chronic headaches 05/15/2015  . ADD (attention deficit disorder) 10/03/2014  . Generalized anxiety disorder 10/03/2014  . Insomnia 10/03/2014    Current Outpatient Prescriptions on File Prior to Visit  Medication Sig Dispense Refill  . cyclobenzaprine (FLEXERIL) 5 MG tablet TAKE 1 TABLET (5 MG TOTAL) BY MOUTH AT BEDTIME 90 tablet 0   No current facility-administered medications on file prior to visit.    Allergies  Allergen Reactions  . Wellbutrin [Bupropion]     angry    Objective:  BP 108/68 mmHg  Pulse 59  Temp(Src) 98.8 F (37.1 C) (Oral)  Ht 5' 7.8" (1.722 m)  Wt 159 lb (72.122 kg)  BMI 24.32 kg/m2  SpO2 98%  Physical Exam  Constitutional: He is oriented to person, place, and time and well-developed, well-nourished, and in no distress.  HENT:  Head: Normocephalic and atraumatic.  Right Ear: External ear normal.  Left  Ear: External ear normal.  Eyes: Conjunctivae are normal.  Neck: Normal range of motion.  Pulmonary/Chest: Effort normal.  Neurological: He is alert and oriented to person, place, and time. Gait normal.  Skin: Skin is warm and dry.  Psychiatric: Mood, memory, affect and judgment normal.    Assessment and Plan :  ADD (attention deficit disorder) - Plan: amphetamine-dextroamphetamine (ADDERALL) 10 MG tablet  Insomnia - Plan: clonazePAM (KLONOPIN) 0.5 MG tablet   Taper off of lexapro over the next several weeks - he will continue to monitor his klonopin usage and as long as it does not increase we can continue without SSRI medication - we can refill his Adderall for the next 6 months  Benny LennertSarah Weber PA-C  [facility] 03/21/2016 5:27 PM

## 2016-03-21 NOTE — Patient Instructions (Addendum)
  Taper the Lexapro to 10mg  daily for a week - and then decrease to 10mg  every other day for a week and then you should be able to stop it   IF you received an x-ray today, you will receive an invoice from [facility]. Please contact [facility] at [phone] with questions or concerns regarding your invoice.   IF you received labwork today, you will receive an invoice from [facility]. Please contact [facility] at [phone] with questions or concerns regarding your invoice.   Our billing staff will not be able to assist you with questions regarding bills from these companies.  You will be contacted with the lab results as soon as they are available. The fastest way to get your results is to activate your My Chart account. Instructions are located on the last page of this paperwork. If you have not heard from us regarding the results in 2 weeks, please contact this office.

## 2016-04-19 ENCOUNTER — Encounter: Payer: Self-pay | Admitting: Physician Assistant

## 2016-04-19 DIAGNOSIS — F988 Other specified behavioral and emotional disorders with onset usually occurring in childhood and adolescence: Secondary | ICD-10-CM

## 2016-04-19 DIAGNOSIS — G4700 Insomnia, unspecified: Secondary | ICD-10-CM

## 2016-04-23 ENCOUNTER — Other Ambulatory Visit: Payer: Self-pay | Admitting: Physician Assistant

## 2016-04-23 DIAGNOSIS — G4700 Insomnia, unspecified: Secondary | ICD-10-CM

## 2016-04-23 DIAGNOSIS — F988 Other specified behavioral and emotional disorders with onset usually occurring in childhood and adolescence: Secondary | ICD-10-CM

## 2016-04-24 ENCOUNTER — Encounter: Payer: Self-pay | Admitting: Physician Assistant

## 2016-04-24 MED ORDER — CYCLOBENZAPRINE HCL 5 MG PO TABS: ORAL_TABLET | ORAL | 0 refills | Status: DC

## 2016-04-24 MED ORDER — CLONAZEPAM 0.5 MG PO TABS: 0.2500 mg | ORAL_TABLET | Freq: Every evening | ORAL | 0 refills | Status: DC | PRN

## 2016-04-24 MED ORDER — AMPHETAMINE-DEXTROAMPHETAMINE 10 MG PO TABS: 10.0000 mg | ORAL_TABLET | Freq: Two times a day (BID) | ORAL | 0 refills | Status: DC

## 2016-04-24 NOTE — Telephone Encounter (Signed)
Rxs in drawer. Answered pt on Mychart about whether his flexeril has already been sent, which it has.

## 2016-04-24 NOTE — Telephone Encounter (Signed)
Done - pt knows through my chart 

## 2016-04-28 MED ORDER — MELOXICAM 7.5 MG PO TABS
7.5000 mg | ORAL_TABLET | Freq: Every day | ORAL | 0 refills | Status: DC
Start: 2016-04-28 — End: 2016-12-18

## 2016-05-09 ENCOUNTER — Emergency Department (HOSPITAL_BASED_OUTPATIENT_CLINIC_OR_DEPARTMENT_OTHER)
Admission: EM | Admit: 2016-05-09 | Discharge: 2016-05-09 | Disposition: A | Discharge status: Home / Self Care | Payer: PRIVATE HEALTH INSURANCE | Attending: Emergency Medicine | Admitting: Emergency Medicine

## 2016-05-09 ENCOUNTER — Encounter (HOSPITAL_BASED_OUTPATIENT_CLINIC_OR_DEPARTMENT_OTHER): Payer: Self-pay | Admitting: Emergency Medicine

## 2016-05-09 DIAGNOSIS — Z791 Long term (current) use of non-steroidal anti-inflammatories (NSAID): Secondary | ICD-10-CM | POA: Insufficient documentation

## 2016-05-09 DIAGNOSIS — F909 Attention-deficit hyperactivity disorder, unspecified type: Secondary | ICD-10-CM | POA: Insufficient documentation

## 2016-05-09 DIAGNOSIS — R51 Headache: Secondary | ICD-10-CM | POA: Insufficient documentation

## 2016-05-09 DIAGNOSIS — R509 Fever, unspecified: Secondary | ICD-10-CM

## 2016-05-09 DIAGNOSIS — J029 Acute pharyngitis, unspecified: Secondary | ICD-10-CM | POA: Insufficient documentation

## 2016-05-09 DIAGNOSIS — F17210 Nicotine dependence, cigarettes, uncomplicated: Secondary | ICD-10-CM | POA: Insufficient documentation

## 2016-05-09 DIAGNOSIS — R519 Headache, unspecified: Secondary | ICD-10-CM

## 2016-05-09 DIAGNOSIS — Z79899 Other long term (current) drug therapy: Secondary | ICD-10-CM | POA: Insufficient documentation

## 2016-05-09 MED ORDER — IBUPROFEN 800 MG PO TABS: 800.0000 mg | ORAL_TABLET | Freq: Three times a day (TID) | ORAL | 0 refills | Status: DC | PRN

## 2016-05-09 MED ORDER — SODIUM CHLORIDE 0.9 % IV BOLUS (SEPSIS)
1000.0000 mL | Freq: Once | INTRAVENOUS | Status: AC
  Administered 2016-05-09: 1000 mL via INTRAVENOUS

## 2016-05-09 MED ORDER — PROCHLORPERAZINE EDISYLATE 5 MG/ML IJ SOLN
10.0000 mg | Freq: Once | INTRAMUSCULAR | Status: AC
Start: 2016-05-09 — End: 2016-05-09
  Administered 2016-05-09: 10 mg via INTRAVENOUS
  Filled 2016-05-09: qty 2

## 2016-05-09 MED ORDER — ACETAMINOPHEN 325 MG PO TABS
650.0000 mg | ORAL_TABLET | Freq: Once | ORAL | Status: AC
  Administered 2016-05-09: 650 mg via ORAL
  Filled 2016-05-09: qty 2

## 2016-05-09 MED ORDER — SODIUM CHLORIDE 0.9 % IV BOLUS (SEPSIS): 1000.0000 mL | Freq: Once | INTRAVENOUS | Status: DC

## 2016-05-09 MED ORDER — KETOROLAC TROMETHAMINE 30 MG/ML IJ SOLN
30.0000 mg | Freq: Once | INTRAMUSCULAR | Status: AC
Start: 2016-05-09 — End: 2016-05-09
  Administered 2016-05-09: 30 mg via INTRAVENOUS
  Filled 2016-05-09: qty 1

## 2016-05-09 NOTE — Discharge Instructions (Signed)
Return here as needed.  Follow-up with your primary care doctor °

## 2016-05-09 NOTE — ED Provider Notes (Signed)
[facility]   CSN: [id number] Arrival date & time: 05/09/16  1131     History   Chief Complaint Chief Complaint  Patient presents with  . Migraine  . Sore Throat  . Fever    HPI Joseph Cordova is a 25 y.o. male.  HPI Patient presents to the emergency department  with headache and fevers.  The patient states that he has had headaches in the past.  This one seems to be lasting longer.Patient states that he has had some fevers with left earache.  He states she has had no nasal congestion, sore throat, neck pain, neck stiffness.  Patient states that he did not take any medications prior to arrival.  Nothing seems to make the condition better or worse, states he does have some photophobia but no other symptoms associated with the headache specificallyThe patient denies chest pain, shortness of breath, headache,blurred vision, neck pain, fever, cough, weakness, numbness, dizziness, anorexia, edema, abdominal pain, nausea, vomiting, diarrhea, rash, back pain, dysuria, hematemesis, bloody stool, near syncope, or syncope. Past Medical History:  Diagnosis Date  . Allergy   . Anxiety   . Arthritis   . History of migraine headaches   . Rocky Mountain spotted fever     Patient Active Problem List   Diagnosis Date Noted  . Bilateral low back pain without sciatica 05/15/2015  . Chronic headaches 05/15/2015  . ADD (attention deficit disorder) 10/03/2014  . Generalized anxiety disorder 10/03/2014  . Insomnia 10/03/2014    Past Surgical History:  Procedure Laterality Date  . TONSILLECTOMY         Home Medications    Prior to Admission medications   Medication Sig Start Date End Date Taking? Authorizing Provider  amphetamine-dextroamphetamine (ADDERALL) 10 MG tablet Take 1 tablet (10 mg total) by mouth 2 (two) times daily with a meal. 04/24/16  Yes Morrell RiddleSarah L Weber, PA-C  clonazePAM (KLONOPIN) 0.5 MG tablet Take 0.5 tablets (0.25 mg total) by mouth at bedtime as  needed for anxiety. 04/24/16  Yes Sarah Harvie BridgeL Weber, PA-C  cyclobenzaprine (FLEXERIL) 5 MG tablet TAKE 1 TABLET (5 MG TOTAL) BY MOUTH AT BEDTIME 04/24/16  Yes Morrell RiddleSarah L Weber, PA-C  meloxicam (MOBIC) 7.5 MG tablet Take 1-2 tablets (7.5-15 mg total) by mouth daily. 04/28/16   Morrell RiddleSarah L Weber, PA-C    Family History Family History  Problem Relation Age of Onset  . Diabetes Mother   . Hyperlipidemia Mother   . Hypertension Mother   . Cancer Maternal Grandmother   . Diabetes Maternal Grandmother   . Hypertension Maternal Grandmother   . Hypertension Maternal Grandfather   . Hyperlipidemia Maternal Grandfather   . Diabetes Maternal Grandfather   . Cancer Paternal Grandmother   . Cancer Paternal Grandfather     Social History Social History  Substance Use Topics  . Smoking status: Current Every Day Smoker    Packs/day: 0.50    Types: Cigarettes  . Smokeless tobacco: Never Used  . Alcohol use 0.0 oz/week     Allergies   Wellbutrin [bupropion]   Review of Systems Review of Systems All other systems negative except as documented in the HPI. All pertinent positives and negatives as reviewed in the HPI.  Physical Exam Updated Vital Signs BP 124/71 (BP Location: Left Arm)   Pulse 107   Temp 99.3 F (37.4 C) (Oral)   Resp 18   Ht 5\' 6"  (1.676 m)   Wt 72.6 kg   SpO2 100%   BMI  25.82 kg/m   Physical Exam  Constitutional: He is oriented to person, place, and time. He appears well-developed and well-nourished. No distress.  HENT:  Head: Normocephalic and atraumatic.  Mouth/Throat: Oropharynx is clear and moist.  Eyes: Pupils are equal, round, and reactive to light.  Neck: Normal range of motion. Neck supple.  Cardiovascular: Normal rate, regular rhythm and normal heart sounds.  Exam reveals no gallop and no friction rub.   No murmur heard. Pulmonary/Chest: Effort normal and breath sounds normal. No respiratory distress. He has no wheezes.  Abdominal: Soft. Bowel sounds are normal.  He exhibits no distension. There is no tenderness.  Neurological: He is alert and oriented to person, place, and time. He exhibits normal muscle tone. Coordination normal.  Skin: Skin is warm and dry. No rash noted. No erythema.  Psychiatric: He has a normal mood and affect. His behavior is normal.  Nursing note and vitals reviewed.    ED Treatments / Results  Labs (all labs ordered are listed, but only abnormal results are displayed) Labs Reviewed - No data to display  EKG  EKG Interpretation None       Radiology No results found.  Procedures Procedures (including critical care time)  Medications Ordered in ED Medications  acetaminophen (TYLENOL) tablet 650 mg (650 mg Oral Given 05/09/16 1153)  sodium chloride 0.9 % bolus 1,000 mL (0 mLs Intravenous Stopped 05/09/16 1519)  ketorolac (TORADOL) 30 MG/ML injection 30 mg (30 mg Intravenous Given 05/09/16 1343)  prochlorperazine (COMPAZINE) injection 10 mg (10 mg Intravenous Given 05/09/16 1343)     Initial Impression / Assessment and Plan / ED Course  I have reviewed the triage vital signs and the nursing notes.  Pertinent labs & imaging results that were available during my care of the patient were reviewed by me and considered in my medical decision making (see chart for details).  Clinical Course   Patient was given IV fluids and medication for his headache.  He is feeling dramatically better.  He does have fever and I explained to him that at this time.  I do not see any signs of infection on examination and he does not have any complaints of any significant signs of an upper respiratory type infection.  The patient has no blurred vision.  He has no signs of neurological deficits on exam is Full range of motion of his neck.  No rigidity   Final Clinical Impressions(s) / ED Diagnoses   Final diagnoses:  None   Patient most likely has a headache associated with a fever.  The patient does not have any other signs or symptoms  of infection.  Patient is feeling better following IV fluids New Prescriptions New Prescriptions   No medications on file     Charlestine Night, PA-C 05/11/16 1449    Geoffery Lyons, MD 05/11/16 1451

## 2016-05-09 NOTE — ED Triage Notes (Signed)
Pt c/o migraine, sore throat and fevers. Reports taking muscle relaxer, tylenol cold and flu w/ no relief. Last taken 0400. A/O NAD at triage.

## 2016-05-17 ENCOUNTER — Other Ambulatory Visit: Payer: Self-pay | Admitting: Physician Assistant

## 2016-05-29 ENCOUNTER — Other Ambulatory Visit: Payer: Self-pay | Admitting: Physician Assistant

## 2016-05-29 DIAGNOSIS — F988 Other specified behavioral and emotional disorders with onset usually occurring in childhood and adolescence: Secondary | ICD-10-CM

## 2016-05-29 DIAGNOSIS — G4700 Insomnia, unspecified: Secondary | ICD-10-CM

## 2016-05-30 ENCOUNTER — Encounter: Payer: Self-pay | Admitting: Physician Assistant

## 2016-05-30 DIAGNOSIS — G4700 Insomnia, unspecified: Secondary | ICD-10-CM

## 2016-05-30 DIAGNOSIS — F988 Other specified behavioral and emotional disorders with onset usually occurring in childhood and adolescence: Secondary | ICD-10-CM

## 2016-06-01 NOTE — Telephone Encounter (Signed)
Last Rfs 8/22

## 2016-06-04 ENCOUNTER — Encounter: Payer: Self-pay | Admitting: Physician Assistant

## 2016-06-04 ENCOUNTER — Other Ambulatory Visit: Payer: Self-pay | Admitting: Physician Assistant

## 2016-06-04 DIAGNOSIS — F988 Other specified behavioral and emotional disorders with onset usually occurring in childhood and adolescence: Secondary | ICD-10-CM

## 2016-06-04 DIAGNOSIS — G4700 Insomnia, unspecified: Secondary | ICD-10-CM

## 2016-06-04 MED ORDER — AMPHETAMINE-DEXTROAMPHETAMINE 10 MG PO TABS: 10.0000 mg | ORAL_TABLET | Freq: Two times a day (BID) | ORAL | 0 refills | Status: DC

## 2016-06-04 MED ORDER — CLONAZEPAM 0.5 MG PO TABS: 0.2500 mg | ORAL_TABLET | Freq: Every evening | ORAL | 0 refills | Status: DC | PRN

## 2016-06-06 NOTE — Telephone Encounter (Signed)
duplicate

## 2016-06-06 NOTE — Telephone Encounter (Signed)
Last ov 03/2016 

## 2016-06-08 ENCOUNTER — Other Ambulatory Visit: Payer: Self-pay | Admitting: Physician Assistant

## 2016-06-11 NOTE — Telephone Encounter (Signed)
PT P/UP

## 2016-07-03 ENCOUNTER — Other Ambulatory Visit: Payer: Self-pay | Admitting: Physician Assistant

## 2016-07-03 DIAGNOSIS — F988 Other specified behavioral and emotional disorders with onset usually occurring in childhood and adolescence: Secondary | ICD-10-CM

## 2016-07-03 DIAGNOSIS — G4700 Insomnia, unspecified: Secondary | ICD-10-CM

## 2016-07-04 MED ORDER — CYCLOBENZAPRINE HCL 5 MG PO TABS: 5.0000 mg | ORAL_TABLET | Freq: Every day | ORAL | 0 refills | Status: DC

## 2016-07-04 NOTE — Telephone Encounter (Signed)
Last RF of both adderall and clonazepam 10/2. Last OV 7/19 and Maralyn SagoSarah wrote following in plan:  ADD (attention deficit disorder) - Plan: amphetamine-dextroamphetamine (ADDERALL) 10 MG tablet  Insomnia - Plan: clonazePAM (KLONOPIN) 0.5 MG tablet   Taper off of lexapro over the next several weeks - he will continue to monitor his klonopin usage and as long as it does not increase we can continue without SSRI medication - we can refill his Adderall for the next 6 months

## 2016-07-05 MED ORDER — CLONAZEPAM 0.5 MG PO TABS: 0.2500 mg | ORAL_TABLET | Freq: Every evening | ORAL | 0 refills | Status: DC | PRN

## 2016-07-05 MED ORDER — AMPHETAMINE-DEXTROAMPHETAMINE 10 MG PO TABS: 10.0000 mg | ORAL_TABLET | Freq: Two times a day (BID) | ORAL | 0 refills | Status: DC

## 2016-07-05 NOTE — Telephone Encounter (Signed)
Up front for pick up 

## 2016-07-05 NOTE — Telephone Encounter (Signed)
Ready to pick up.  

## 2016-07-10 ENCOUNTER — Other Ambulatory Visit: Payer: Self-pay | Admitting: Physician Assistant

## 2016-07-10 DIAGNOSIS — F988 Other specified behavioral and emotional disorders with onset usually occurring in childhood and adolescence: Secondary | ICD-10-CM

## 2016-07-10 DIAGNOSIS — G4700 Insomnia, unspecified: Secondary | ICD-10-CM

## 2016-07-12 ENCOUNTER — Encounter (HOSPITAL_BASED_OUTPATIENT_CLINIC_OR_DEPARTMENT_OTHER): Payer: Self-pay | Admitting: *Deleted

## 2016-07-12 ENCOUNTER — Emergency Department (HOSPITAL_BASED_OUTPATIENT_CLINIC_OR_DEPARTMENT_OTHER)
Admission: EM | Admit: 2016-07-12 | Discharge: 2016-07-12 | Disposition: A | Discharge status: Home / Self Care | Payer: PRIVATE HEALTH INSURANCE | Attending: Emergency Medicine | Admitting: Emergency Medicine

## 2016-07-12 DIAGNOSIS — Z79899 Other long term (current) drug therapy: Secondary | ICD-10-CM | POA: Insufficient documentation

## 2016-07-12 DIAGNOSIS — X58XXXA Exposure to other specified factors, initial encounter: Secondary | ICD-10-CM | POA: Insufficient documentation

## 2016-07-12 DIAGNOSIS — Z23 Encounter for immunization: Secondary | ICD-10-CM | POA: Insufficient documentation

## 2016-07-12 DIAGNOSIS — S41111A Laceration without foreign body of right upper arm, initial encounter: Secondary | ICD-10-CM | POA: Insufficient documentation

## 2016-07-12 DIAGNOSIS — Y929 Unspecified place or not applicable: Secondary | ICD-10-CM | POA: Insufficient documentation

## 2016-07-12 DIAGNOSIS — Y990 Civilian activity done for income or pay: Secondary | ICD-10-CM | POA: Insufficient documentation

## 2016-07-12 DIAGNOSIS — F909 Attention-deficit hyperactivity disorder, unspecified type: Secondary | ICD-10-CM | POA: Insufficient documentation

## 2016-07-12 DIAGNOSIS — Y939 Activity, unspecified: Secondary | ICD-10-CM | POA: Insufficient documentation

## 2016-07-12 DIAGNOSIS — F17210 Nicotine dependence, cigarettes, uncomplicated: Secondary | ICD-10-CM | POA: Insufficient documentation

## 2016-07-12 MED ORDER — TETANUS-DIPHTH-ACELL PERTUSSIS 5-2.5-18.5 LF-MCG/0.5 IM SUSP
0.5000 mL | Freq: Once | INTRAMUSCULAR | Status: AC
  Administered 2016-07-12: 0.5 mL via INTRAMUSCULAR
  Filled 2016-07-12: qty 0.5

## 2016-07-12 MED ORDER — LIDOCAINE-EPINEPHRINE (PF) 2 %-1:200000 IJ SOLN
10.0000 mL | Freq: Once | INTRAMUSCULAR | Status: AC
  Administered 2016-07-12: 10 mL via INTRADERMAL
  Filled 2016-07-12: qty 10

## 2016-07-12 NOTE — ED Provider Notes (Signed)
[facility]   CSN: [id number] Arrival date & time: 07/12/16  1006     History   Chief Complaint Chief Complaint  Patient presents with  . Laceration    HPI Joseph Cordova is a 25 y.o. male.  HPI   25yo male presents with concern of laceration to his right arm. Reports he is working on a boat, and laceration occurred. Reports last tetanus was 5 or 10 years ago. Pain is mild. Occurred just prior to arrival.   Past Medical History:  Diagnosis Date  . Allergy   . Anxiety   . Arthritis   . History of migraine headaches   . Rocky Mountain spotted fever     Patient Active Problem List   Diagnosis Date Noted  . Bilateral low back pain without sciatica 05/15/2015  . Chronic headaches 05/15/2015  . ADD (attention deficit disorder) 10/03/2014  . Generalized anxiety disorder 10/03/2014  . Insomnia 10/03/2014    Past Surgical History:  Procedure Laterality Date  . TONSILLECTOMY    . WISDOM TOOTH EXTRACTION         Home Medications    Prior to Admission medications   Medication Sig Start Date End Date Taking? Authorizing Provider  amphetamine-dextroamphetamine (ADDERALL) 10 MG tablet Take 1 tablet (10 mg total) by mouth 2 (two) times daily with a meal. 07/05/16  Yes Morrell RiddleSarah L Weber, PA-C  clonazePAM (KLONOPIN) 0.5 MG tablet Take 0.5 tablets (0.25 mg total) by mouth at bedtime as needed for anxiety. 07/05/16  Yes Morrell RiddleSarah L Weber, PA-C  cyclobenzaprine (FLEXERIL) 5 MG tablet Take 1 tablet (5 mg total) by mouth at bedtime. 07/04/16  Yes Morrell RiddleSarah L Weber, PA-C  ibuprofen (ADVIL,MOTRIN) 800 MG tablet Take 1 tablet (800 mg total) by mouth every 8 (eight) hours as needed. 05/09/16   Charlestine Nighthristopher Lawyer, PA-C  meloxicam (MOBIC) 7.5 MG tablet Take 1-2 tablets (7.5-15 mg total) by mouth daily. 04/28/16   Morrell RiddleSarah L Weber, PA-C    Family History Family History  Problem Relation Age of Onset  . Diabetes Mother   . Hyperlipidemia Mother   . Hypertension Mother   . Cancer  Maternal Grandmother   . Diabetes Maternal Grandmother   . Hypertension Maternal Grandmother   . Hypertension Maternal Grandfather   . Hyperlipidemia Maternal Grandfather   . Diabetes Maternal Grandfather   . Cancer Paternal Grandmother   . Cancer Paternal Grandfather     Social History Social History  Substance Use Topics  . Smoking status: Current Every Day Smoker    Packs/day: 0.50    Types: Cigarettes  . Smokeless tobacco: Current User    Types: Snuff  . Alcohol use 0.0 oz/week     Comment: 1x wk     Allergies   Wellbutrin [bupropion]   Review of Systems Review of Systems  Constitutional: Negative for fever.  HENT: Negative for sore throat.   Eyes: Negative for visual disturbance.  Respiratory: Negative for shortness of breath.   Cardiovascular: Negative for chest pain.  Gastrointestinal: Negative for abdominal pain.  Genitourinary: Negative for difficulty urinating.  Musculoskeletal: Negative for back pain and neck stiffness.  Skin: Positive for wound. Negative for rash.  Neurological: Negative for syncope and headaches.     Physical Exam Updated Vital Signs BP 128/74   Pulse 77   Temp 99.4 F (37.4 C) (Oral)   Resp 16   SpO2 100%   Physical Exam  Constitutional: He is oriented to person, place, and time. He appears  well-developed and well-nourished. No distress.  HENT:  Head: Normocephalic and atraumatic.  Eyes: Conjunctivae and EOM are normal.  Neck: Normal range of motion.  Cardiovascular: Normal rate, regular rhythm, normal heart sounds and intact distal pulses.   Pulmonary/Chest: Effort normal. No respiratory distress.  Musculoskeletal: He exhibits no edema.  Neurological: He is alert and oriented to person, place, and time.  Skin: Skin is warm and dry. He is not diaphoretic.  Laceration 2cm right lateral upper arm  Nursing note and vitals reviewed.    ED Treatments / Results  Labs (all labs ordered are listed, but only abnormal results  are displayed) Labs Reviewed - No data to display  EKG  EKG Interpretation None       Radiology No results found.  Procedures .Marland Kitchen.Laceration Repair Date/Time: 07/12/2016 8:17 PM Performed by: Alvira MondaySCHLOSSMAN, Willadene Mounsey Authorized by: Alvira MondaySCHLOSSMAN, Floy Riegler   Consent:    Consent obtained:  Verbal   Consent given by:  Patient   Risks discussed:  Infection, pain and poor wound healing   Alternatives discussed:  No treatment and observation Anesthesia (see MAR for exact dosages):    Anesthesia method:  Local infiltration   Local anesthetic:  Lidocaine 1% WITH epi Laceration details:    Location:  Shoulder/arm   Shoulder/arm location:  R upper arm   Length (cm):  2 Repair type:    Repair type:  Simple Pre-procedure details:    Preparation:  Patient was prepped and draped in usual sterile fashion Exploration:    Wound exploration: entire depth of wound probed and visualized     Wound extent: no foreign bodies/material noted     Contaminated: no   Treatment:    Area cleansed with:  Betadine   Amount of cleaning:  Standard   Irrigation solution:  Sterile saline   Irrigation volume:  300   Irrigation method:  Pressure wash   Visualized foreign bodies/material removed: no   Skin repair:    Repair method:  Sutures   Suture size:  4-0   Wound skin closure material used: vicryl rapide.   Number of sutures:  3 Approximation:    Approximation:  Close Post-procedure details:    Dressing:  Antibiotic ointment   Patient tolerance of procedure:  Tolerated well, no immediate complications   (including critical care time)  Medications Ordered in ED Medications  lidocaine-EPINEPHrine (XYLOCAINE W/EPI) 2 %-1:200000 (PF) injection 10 mL (10 mLs Intradermal Given by Other 07/12/16 1058)  Tdap (BOOSTRIX) injection 0.5 mL (0.5 mLs Intramuscular Given 07/12/16 1059)     Initial Impression / Assessment and Plan / ED Course  I have reviewed the triage vital signs and the nursing notes.  Pertinent  labs & imaging results that were available during my care of the patient were reviewed by me and considered in my medical decision making (see chart for details).  Clinical Course    [age] her old male presents with concern of laceration to the right arm. Laceration is superficial, and see no sign of foreign body. His tetanus is updated. Discussed options with patient including repair versus healing by secondary intention, and patient prefers wound closure. Area was numbed with lidocaine and sutured using vicryl rapide.  Discussed looking for signs of infection. Patient discharged in stable condition with understanding of reasons to return.    Final Clinical Impressions(s) / ED Diagnoses   Final diagnoses:  Arm laceration, right, initial encounter    New Prescriptions Discharge Medication List as of 07/12/2016 11:33 AM  Alvira Monday, MD 07/12/16 2022

## 2016-07-12 NOTE — ED Notes (Signed)
ED Provider at bedside. 

## 2016-07-12 NOTE — ED Triage Notes (Signed)
Pt cut back of right upper arm when exiting a boat- approx 2cm curved lac noted with bleeding controlled

## 2016-07-13 ENCOUNTER — Encounter: Payer: Self-pay | Admitting: Physician Assistant

## 2016-07-16 NOTE — Telephone Encounter (Signed)
See other e-mail as well

## 2016-07-17 MED ORDER — TRAMADOL HCL 50 MG PO TABS: 50.0000 mg | ORAL_TABLET | Freq: Every evening | ORAL | 0 refills | Status: DC | PRN

## 2016-07-17 NOTE — Telephone Encounter (Signed)
Pt knows through mychart 

## 2016-08-13 ENCOUNTER — Other Ambulatory Visit: Payer: Self-pay | Admitting: Physician Assistant

## 2016-08-13 DIAGNOSIS — F988 Other specified behavioral and emotional disorders with onset usually occurring in childhood and adolescence: Secondary | ICD-10-CM

## 2016-08-13 DIAGNOSIS — G4700 Insomnia, unspecified: Secondary | ICD-10-CM

## 2016-08-14 MED ORDER — AMPHETAMINE-DEXTROAMPHETAMINE 10 MG PO TABS: 10.0000 mg | ORAL_TABLET | Freq: Two times a day (BID) | ORAL | 0 refills | Status: DC

## 2016-08-14 MED ORDER — CLONAZEPAM 0.5 MG PO TABS: 0.2500 mg | ORAL_TABLET | Freq: Every evening | ORAL | 0 refills | Status: DC | PRN

## 2016-08-14 NOTE — Telephone Encounter (Signed)
Done

## 2016-09-13 ENCOUNTER — Other Ambulatory Visit: Payer: Self-pay | Admitting: Physician Assistant

## 2016-09-13 DIAGNOSIS — G4700 Insomnia, unspecified: Secondary | ICD-10-CM

## 2016-09-13 DIAGNOSIS — F988 Other specified behavioral and emotional disorders with onset usually occurring in childhood and adolescence: Secondary | ICD-10-CM

## 2016-09-14 MED ORDER — AMPHETAMINE-DEXTROAMPHETAMINE 10 MG PO TABS: 10.0000 mg | ORAL_TABLET | Freq: Two times a day (BID) | ORAL | 0 refills | Status: DC

## 2016-09-14 MED ORDER — CLONAZEPAM 0.5 MG PO TABS: 0.2500 mg | ORAL_TABLET | Freq: Every evening | ORAL | 0 refills | Status: DC | PRN

## 2016-09-14 NOTE — Telephone Encounter (Signed)
Rx ready -please have the patient make an appt before this medication runs out

## 2016-09-15 ENCOUNTER — Telehealth: Payer: Self-pay | Admitting: Emergency Medicine

## 2016-09-15 NOTE — Telephone Encounter (Signed)
Left message scripts are ready for pick up.

## 2016-09-18 NOTE — Telephone Encounter (Signed)
Up front 

## 2016-10-15 ENCOUNTER — Other Ambulatory Visit: Payer: Self-pay | Admitting: Physician Assistant

## 2016-10-15 DIAGNOSIS — G4700 Insomnia, unspecified: Secondary | ICD-10-CM

## 2016-10-15 DIAGNOSIS — F988 Other specified behavioral and emotional disorders with onset usually occurring in childhood and adolescence: Secondary | ICD-10-CM

## 2016-10-16 NOTE — Telephone Encounter (Signed)
03/2016 last ov 

## 2016-10-17 NOTE — Telephone Encounter (Signed)
I will do this when I get to clinic on 2/15

## 2016-10-18 MED ORDER — CLONAZEPAM 0.5 MG PO TABS: 0.2500 mg | ORAL_TABLET | Freq: Every evening | ORAL | 0 refills | Status: DC | PRN

## 2016-10-18 MED ORDER — AMPHETAMINE-DEXTROAMPHETAMINE 10 MG PO TABS: 10.0000 mg | ORAL_TABLET | Freq: Two times a day (BID) | ORAL | 0 refills | Status: DC

## 2016-10-18 NOTE — Telephone Encounter (Signed)
Done - ready to pick up - he will need to make an appt with me before

## 2016-10-19 NOTE — Telephone Encounter (Signed)
rx at front desk for pick up

## 2016-10-21 ENCOUNTER — Other Ambulatory Visit: Payer: Self-pay | Admitting: Physician Assistant

## 2016-10-21 DIAGNOSIS — F988 Other specified behavioral and emotional disorders with onset usually occurring in childhood and adolescence: Secondary | ICD-10-CM

## 2016-10-21 DIAGNOSIS — G4700 Insomnia, unspecified: Secondary | ICD-10-CM

## 2016-10-23 ENCOUNTER — Encounter: Payer: Self-pay | Admitting: Physician Assistant

## 2016-12-18 ENCOUNTER — Emergency Department (HOSPITAL_BASED_OUTPATIENT_CLINIC_OR_DEPARTMENT_OTHER)
Admission: EM | Admit: 2016-12-18 | Discharge: 2016-12-18 | Disposition: A | Discharge status: Home / Self Care | Payer: PRIVATE HEALTH INSURANCE | Attending: Emergency Medicine | Admitting: Emergency Medicine

## 2016-12-18 ENCOUNTER — Encounter (HOSPITAL_BASED_OUTPATIENT_CLINIC_OR_DEPARTMENT_OTHER): Payer: Self-pay

## 2016-12-18 DIAGNOSIS — F17290 Nicotine dependence, other tobacco product, uncomplicated: Secondary | ICD-10-CM | POA: Insufficient documentation

## 2016-12-18 DIAGNOSIS — W268XXA Contact with other sharp object(s), not elsewhere classified, initial encounter: Secondary | ICD-10-CM | POA: Insufficient documentation

## 2016-12-18 DIAGNOSIS — Z79899 Other long term (current) drug therapy: Secondary | ICD-10-CM | POA: Insufficient documentation

## 2016-12-18 DIAGNOSIS — Y929 Unspecified place or not applicable: Secondary | ICD-10-CM | POA: Insufficient documentation

## 2016-12-18 DIAGNOSIS — S51812A Laceration without foreign body of left forearm, initial encounter: Secondary | ICD-10-CM

## 2016-12-18 DIAGNOSIS — Y990 Civilian activity done for income or pay: Secondary | ICD-10-CM | POA: Insufficient documentation

## 2016-12-18 DIAGNOSIS — Y9389 Activity, other specified: Secondary | ICD-10-CM | POA: Insufficient documentation

## 2016-12-18 DIAGNOSIS — F17210 Nicotine dependence, cigarettes, uncomplicated: Secondary | ICD-10-CM | POA: Insufficient documentation

## 2016-12-18 MED ORDER — HYDROCODONE-ACETAMINOPHEN 5-325 MG PO TABS
1.0000 | ORAL_TABLET | Freq: Once | ORAL | Status: AC
  Administered 2016-12-18: 1 via ORAL
  Filled 2016-12-18: qty 1

## 2016-12-18 MED ORDER — HYDROCODONE-ACETAMINOPHEN 5-325 MG PO TABS: 1.0000 | ORAL_TABLET | Freq: Four times a day (QID) | ORAL | 0 refills | Status: DC | PRN

## 2016-12-18 MED FILL — HYDROCODON-APAP 5-325: 5-325 | 2 days supply | Qty: 10 | Fill #0

## 2016-12-18 NOTE — ED Notes (Signed)
ED Provider at bedside. 

## 2016-12-18 NOTE — ED Provider Notes (Signed)
[facility]   CSN: [id number] Arrival date & time: 12/18/16  1416     History   Chief Complaint Chief Complaint  Patient presents with  . Arm Injury    HPI Joseph Reihl is a 26 y.o. male.  Patient status post laceration at work. Works as a Ambulance person. Lacerations to left forearm. Bleeding currently controlled. Patient states his tetanus is up-to-date last tetanus was within the last year. No other injuries. Denies any numbness or weakness to his fingers.      Past Medical History:  Diagnosis Date  . Allergy   . Anxiety   . Arthritis   . History of migraine headaches   . Rocky Mountain spotted fever     Patient Active Problem List   Diagnosis Date Noted  . Bilateral low back pain without sciatica 05/15/2015  . Chronic headaches 05/15/2015  . ADD (attention deficit disorder) 10/03/2014  . Generalized anxiety disorder 10/03/2014  . Insomnia 10/03/2014    Past Surgical History:  Procedure Laterality Date  . TONSILLECTOMY    . WISDOM TOOTH EXTRACTION         Home Medications    Prior to Admission medications   Medication Sig Start Date End Date Taking? Authorizing Provider  amphetamine-dextroamphetamine (ADDERALL) 10 MG tablet Take 1 tablet (10 mg total) by mouth 2 (two) times daily with a meal. 10/18/16   Morrell Riddle, PA-C  clonazePAM (KLONOPIN) 0.5 MG tablet Take 0.5 tablets (0.25 mg total) by mouth at bedtime as needed for anxiety. 10/18/16   Morrell Riddle, PA-C  HYDROcodone-acetaminophen (NORCO/VICODIN) 5-325 MG tablet Take 1-2 tablets by mouth every 6 (six) hours as needed for moderate pain. 12/18/16   Vanetta Mulders, MD  ibuprofen (ADVIL,MOTRIN) 800 MG tablet Take 1 tablet (800 mg total) by mouth every 8 (eight) hours as needed. 05/09/16   Charlestine Night, PA-C    Family History Family History  Problem Relation Age of Onset  . Diabetes Mother   . Hyperlipidemia Mother   . Hypertension Mother   . Cancer Maternal  Grandmother   . Diabetes Maternal Grandmother   . Hypertension Maternal Grandmother   . Hypertension Maternal Grandfather   . Hyperlipidemia Maternal Grandfather   . Diabetes Maternal Grandfather   . Cancer Paternal Grandmother   . Cancer Paternal Grandfather     Social History Social History  Substance Use Topics  . Smoking status: Current Every Day Smoker    Packs/day: 0.50    Types: Cigarettes  . Smokeless tobacco: Current User    Types: Snuff  . Alcohol use Yes     Comment: occ     Allergies   Wellbutrin [bupropion]   Review of Systems Review of Systems  Constitutional: Negative for fever.  HENT: Negative for congestion.   Eyes: Negative for redness.  Respiratory: Negative for shortness of breath.   Cardiovascular: Negative for chest pain.  Gastrointestinal: Negative for abdominal pain.  Skin: Positive for wound.  Neurological: Negative for weakness and numbness.  Hematological: Does not bruise/bleed easily.  Psychiatric/Behavioral: Negative for confusion.     Physical Exam Updated Vital Signs BP 120/79 (BP Location: Right Arm)   Pulse 94   Temp 98.4 F (36.9 C) (Oral)   Resp 16   Ht  (1.753 m)   Wt 72.6 kg   SpO2 100%   BMI 23.63 kg/m   Physical Exam  Constitutional: He is oriented to person, place, and time. He appears well-developed and well-nourished. No  distress.  HENT:  Head: Normocephalic and atraumatic.  Mouth/Throat: Oropharynx is clear and moist.  Eyes: EOM are normal. Pupils are equal, round, and reactive to light.  Neck: Normal range of motion. Neck supple.  Cardiovascular: Normal rate and regular rhythm.   Pulmonary/Chest: Effort normal and breath sounds normal.  Abdominal: Soft. Bowel sounds are normal.  Musculoskeletal:  Normal except for left forearm with a proximal 2 cm laceration ulnar side. Through the subcutaneous tissue adipose exposed no evidence of any deep structure injury. Tendons all intact got to the wrist forearm  and hand. Radial and ulnar pulse 2+. Sensation intact distally.  Neurological: He is alert and oriented to person, place, and time. No cranial nerve deficit or sensory deficit. He exhibits normal muscle tone. Coordination normal.  Skin: Skin is warm.  Nursing note and vitals reviewed.    ED Treatments / Results  Labs (all labs ordered are listed, but only abnormal results are displayed) Labs Reviewed - No data to display  EKG  EKG Interpretation None       Radiology No results found.  Procedures Procedures (including critical care time)  Medications Ordered in ED Medications  HYDROcodone-acetaminophen (NORCO/VICODIN) 5-325 MG per tablet 1 tablet (1 tablet Oral Given 12/18/16 1536)     Initial Impression / Assessment and Plan / ED Course  I have reviewed the triage vital signs and the nursing notes.  Pertinent labs & imaging results that were available during my care of the patient were reviewed by me and considered in my medical decision making (see chart for details).     Patient with laceration which occurred at work to left forearm measuring about 2 cm. Not involving any deep structures. No tendon injury. Known nerve injury.  Wound was cleaned. Closed with 3 stainless steel staples. Dressed with bacitracin ointment and nonadherent dressing.   LACERATION REPAIR Performed by: Vanetta Mulders Authorized by: Vanetta Mulders Consent: Verbal consent obtained. Risks and benefits: risks, benefits and alternatives were discussed Consent given by: patient Patient identity confirmed: provided demographic data Prepped and Draped in normal sterile fashion Wound explored  Laceration Location: Left forearm  Laceration Length: 2 cm  No Foreign Bodies seen or palpated  Anesthesia: local infiltration  Local anesthetic: None   Anesthetic total: 0 ml  Irrigation method: syringe Amount of cleaning: standard  Skin closure: 3 stainless steel staples   Number of  sutures: 3 stainless steel staples   Technique: Stapling   Patient tolerance: Patient tolerated the procedure well with no immediate complications.     Final Clinical Impressions(s) / ED Diagnoses   Final diagnoses:  Forearm laceration, left, initial encounter    New Prescriptions New Prescriptions   HYDROCODONE-ACETAMINOPHEN (NORCO/VICODIN) 5-325 MG TABLET    Take 1-2 tablets by mouth every 6 (six) hours as needed for moderate pain.     Vanetta Mulders, MD 12/18/16 1655

## 2016-12-18 NOTE — ED Triage Notes (Signed)
Pt states he cut his arm on a boat part at work approx 20-30 min PTA-lac noted with bleeding controlled-cleaned and dressed in triage

## 2016-12-18 NOTE — Discharge Instructions (Signed)
Keep the wound dry and bandaged for 2 days. Then remove the bandage and redress with antibiotic ointment and a new bandage - each day.   Work note provided.  Take the hydrocodone as needed for pain.  Return for new or worse symptoms.

## 2017-09-10 IMAGING — CR DG KNEE COMPLETE 4+V*R*
4 series · 4 of 4 positions shown · non-contrast
Comparison: None.

CLINICAL DATA: Right knee pain and swelling without reported
injury.

EXAM:
RIGHT KNEE - COMPLETE 4+ VIEW

[AP]
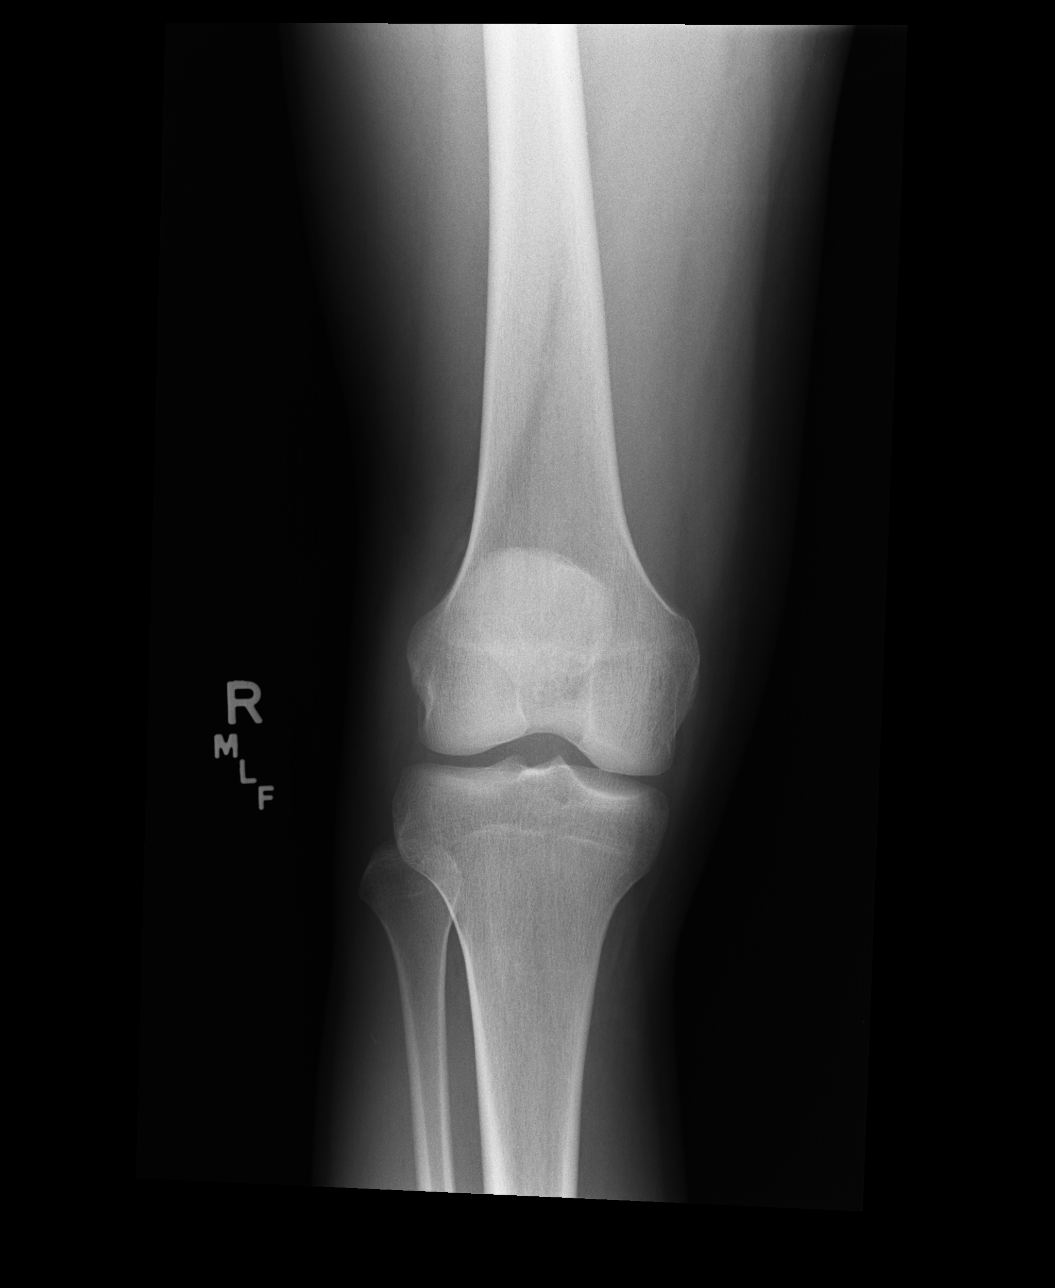

[lateral]
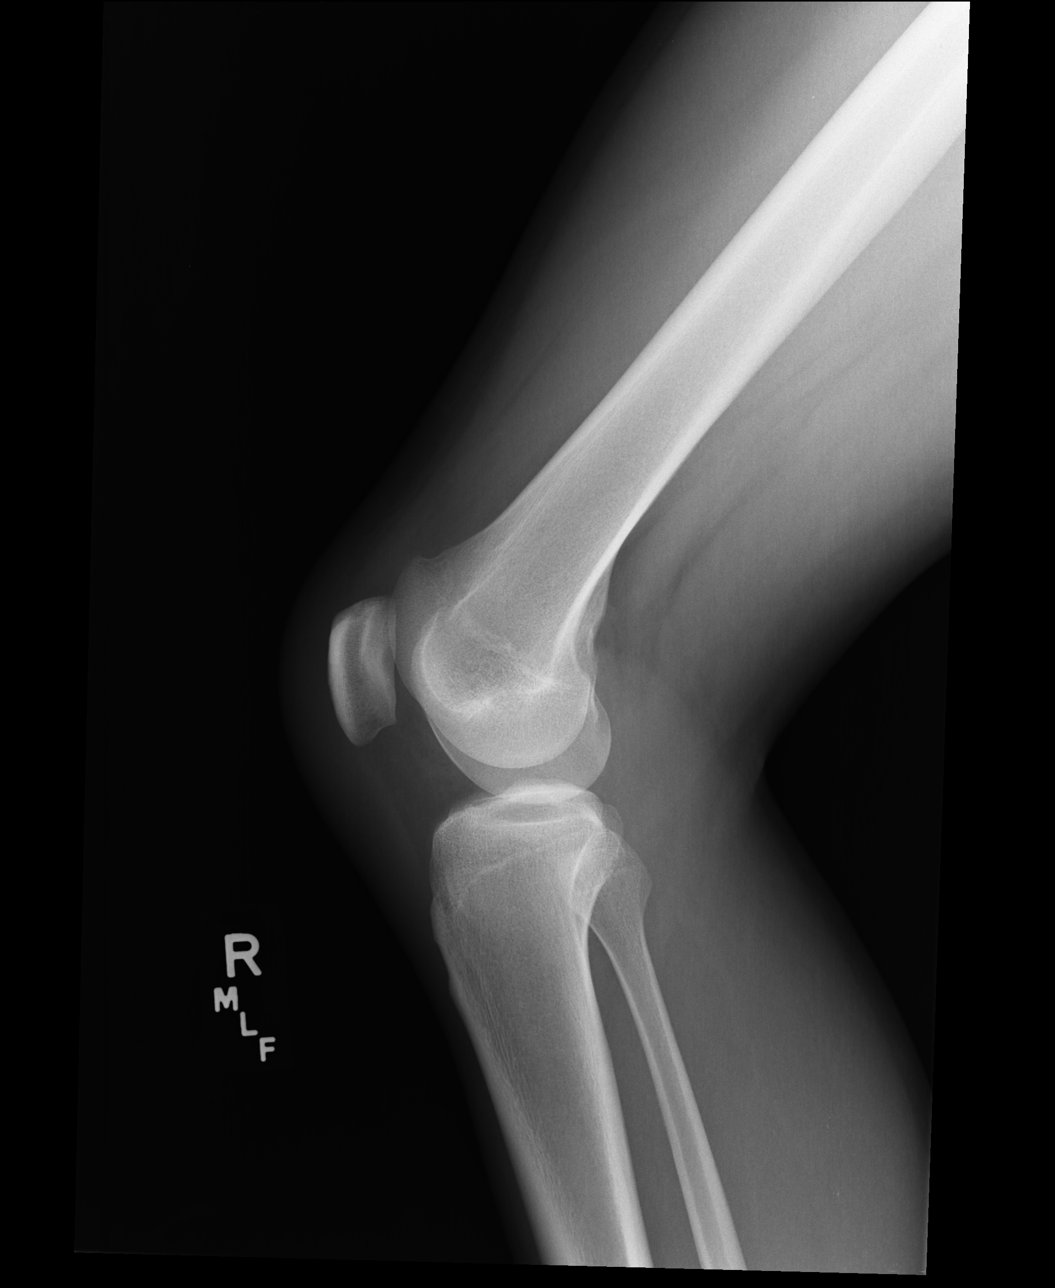

[ap ext rot]
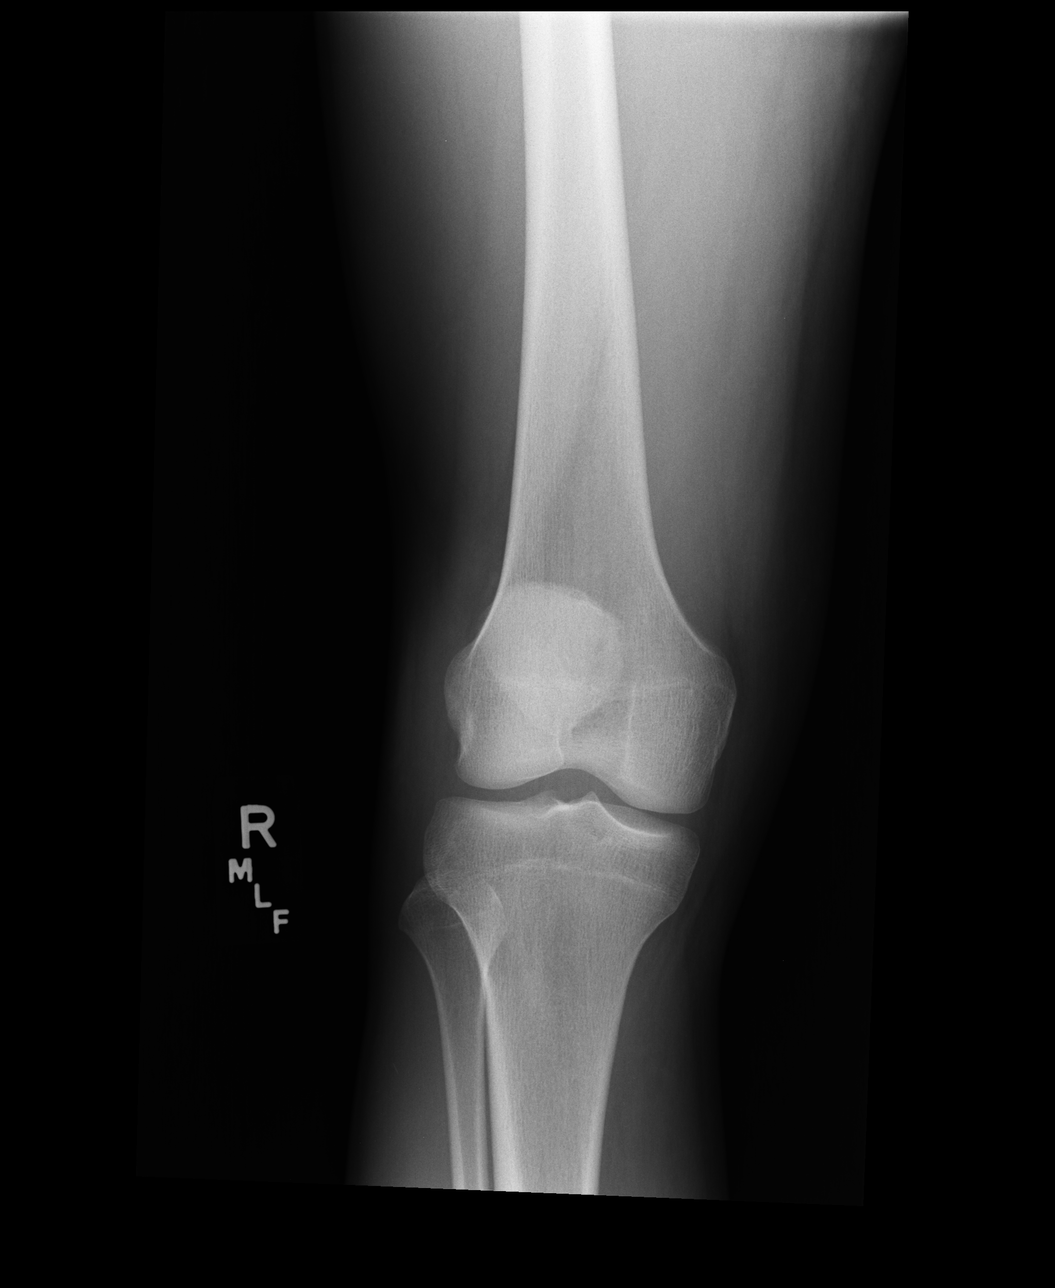

[ap int rot]
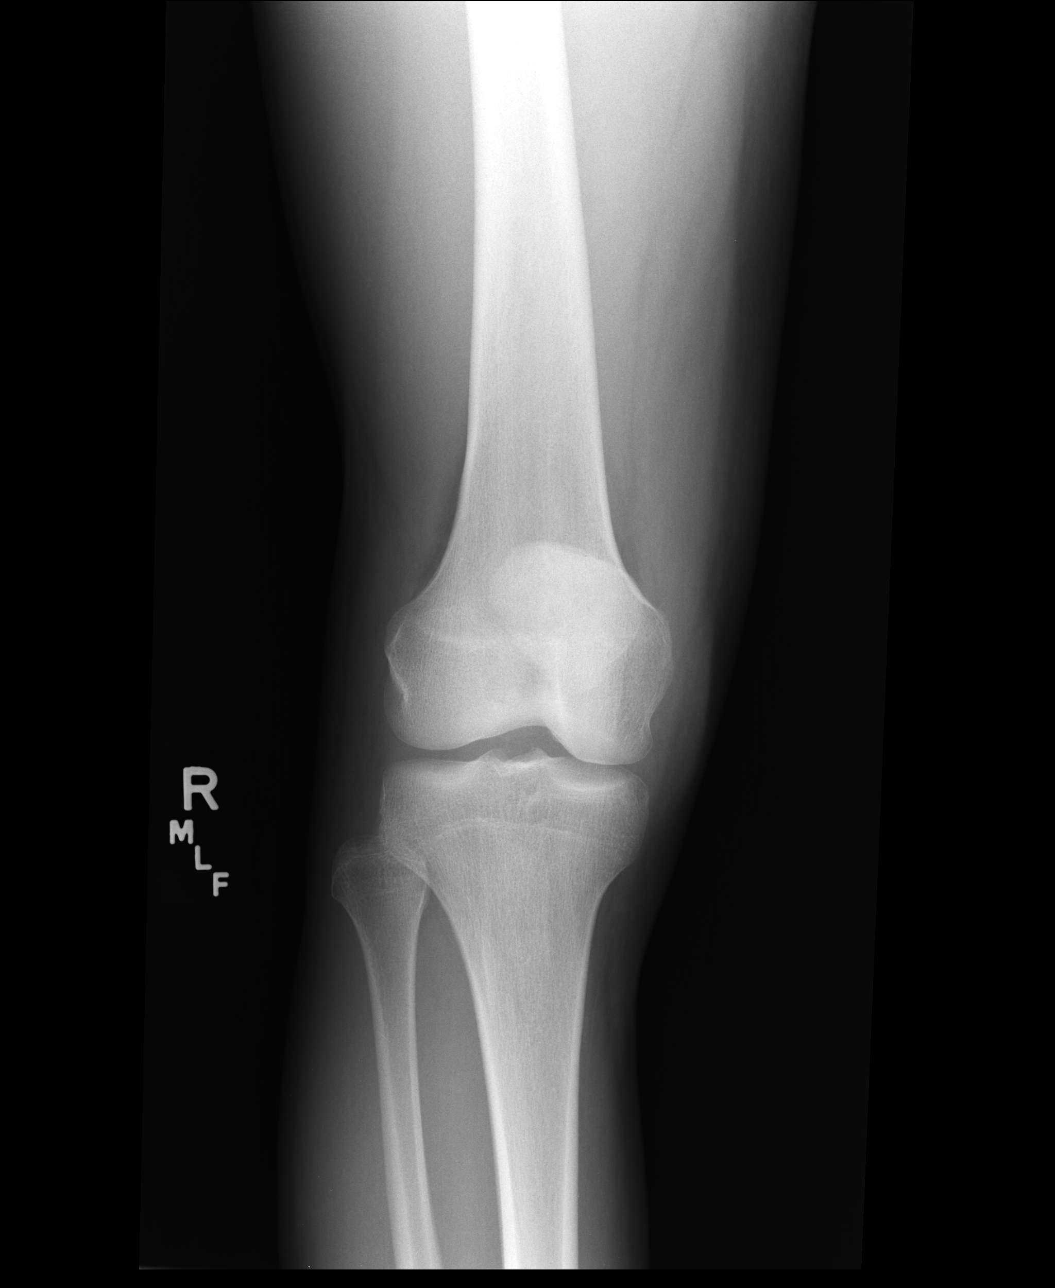

[4 of 4 positions shown; findings below may reference images not displayed]

FINDINGS: There is no evidence of fracture, dislocation, or joint effusion.
There is no evidence of arthropathy or other focal bone abnormality.
Soft tissues are unremarkable.
IMPRESSION: Normal right knee.

## 2022-11-21 ENCOUNTER — Ambulatory Visit (HOSPITAL_BASED_OUTPATIENT_CLINIC_OR_DEPARTMENT_OTHER): Admit: 2022-11-21 | Payer: No Typology Code available for payment source | Admitting: Orthopaedic Surgery

## 2022-11-21 ENCOUNTER — Encounter (HOSPITAL_BASED_OUTPATIENT_CLINIC_OR_DEPARTMENT_OTHER): Payer: Self-pay

## 2022-11-21 SURGERY — REPAIR, LIGAMENT
Anesthesia: Choice | Site: Ankle | Laterality: Left

## 2023-10-27 ENCOUNTER — Other Ambulatory Visit: Payer: Self-pay

## 2023-10-27 ENCOUNTER — Emergency Department (HOSPITAL_COMMUNITY): Payer: BC Managed Care – PPO

## 2023-10-27 ENCOUNTER — Emergency Department (HOSPITAL_COMMUNITY)
Admission: EM | Admit: 2023-10-27 | Discharge: 2023-10-27 | Disposition: A | Discharge status: Home / Self Care | Payer: BC Managed Care – PPO | Attending: Emergency Medicine | Admitting: Emergency Medicine

## 2023-10-27 DIAGNOSIS — R1031 Right lower quadrant pain: Secondary | ICD-10-CM | POA: Diagnosis not present

## 2023-10-27 DIAGNOSIS — R109 Unspecified abdominal pain: Secondary | ICD-10-CM | POA: Diagnosis not present

## 2023-10-27 LAB — URINALYSIS, ROUTINE W REFLEX MICROSCOPIC
Bilirubin Urine: NEGATIVE
Glucose, UA: NEGATIVE mg/dL
Hgb urine dipstick: NEGATIVE
Ketones, ur: NEGATIVE mg/dL
Leukocytes,Ua: NEGATIVE
Nitrite: NEGATIVE
Protein, ur: NEGATIVE mg/dL
Specific Gravity, Urine: 1.005 (ref 1.005–1.030)
pH: 6 (ref 5.0–8.0)

## 2023-10-27 LAB — CBC
HCT: 46.8 % (ref 39.0–52.0)
Hemoglobin: 16 g/dL (ref 13.0–17.0)
MCH: 30.7 pg (ref 26.0–34.0)
MCHC: 34.2 g/dL (ref 30.0–36.0)
MCV: 89.8 fL (ref 80.0–100.0)
Platelets: 382 10*3/uL (ref 150–400)
RBC: 5.21 MIL/uL (ref 4.22–5.81)
RDW: 12.3 % (ref 11.5–15.5)
WBC: 6.6 10*3/uL (ref 4.0–10.5)
nRBC: 0 % (ref 0.0–0.2)

## 2023-10-27 LAB — BASIC METABOLIC PANEL
Anion gap: 8 (ref 5–15)
BUN: 13 mg/dL (ref 6–20)
CO2: 27 mmol/L (ref 22–32)
Calcium: 9.2 mg/dL (ref 8.9–10.3)
Chloride: 104 mmol/L (ref 98–111)
Creatinine, Ser: 1.11 mg/dL (ref 0.61–1.24)
GFR, Estimated: >60 mL/min (ref >60)
Glucose, Bld: 108 mg/dL — ABNORMAL HIGH (ref 70–99)
Potassium: 4 mmol/L (ref 3.5–5.1)
Sodium: 139 mmol/L (ref 135–145)

## 2023-10-27 MED ORDER — ACETAMINOPHEN 500 MG PO TABS
1000.0000 mg | ORAL_TABLET | Freq: Once | ORAL | Status: DC
  Filled 2023-10-27: qty 2

## 2023-10-27 MED ORDER — IOHEXOL 300 MG/ML  SOLN
100.0000 mL | Freq: Once | INTRAMUSCULAR | Status: AC | PRN
  Administered 2023-10-27: 100 mL via INTRAVENOUS

## 2023-10-27 NOTE — Discharge Instructions (Signed)
 Your blood work is reassuring and CT looks good. You have a possible Myron Stankovich mass on your liver, please follow-up with your PCP for further imaging. This is unlikely the cause of your pain.

## 2023-10-27 NOTE — ED Notes (Signed)
 This RN took patient to CT per CT tech's request.

## 2023-10-27 NOTE — ED Notes (Signed)
 Patient returned to room from CT.

## 2023-10-27 NOTE — ED Triage Notes (Signed)
 Patient to ED by POV with c/o right side ABD and groin pain for over a month. He voices nausea, loss of appetite and soft stools. Laying down increases pain, took OTC Tylenol and Ibuprofen with no relief.

## 2023-10-27 NOTE — ED Provider Notes (Signed)
 [place] EMERGENCY DEPARTMENT AT [facility] Provider Note   CSN: [id number] Arrival date & time: 10/27/23  1448     History  Chief Complaint  Patient presents with   Groin Pain    Joseph Cordova is a 33 y.o. male, given past medical history, who presents to the ED secondary to right flank pain, has been going on for the last month, initially was intermittent, now has been persistent for the last week.  He reports the area is cramping, and states it feels like when he has to have a bowel movement.  He notes every time he has this, however he does not have a bowel movement.  He reports also some softer looser stools for the last month.  Has bright red blood per rectum, when straining, but denies any pencillike stools.  He has tried ibuprofen and Tylenol without relief.  He has not noticed that food, or movement make it worse.  He denies any testicular pain, urinary symptoms, or concern for STDs.  No chest pain or shortness of breath.  Does work lifting things on a daily basis.  Home Medications Prior to Admission medications   Medication Sig Start Date End Date Taking? Authorizing Provider  amphetamine-dextroamphetamine (ADDERALL) 10 MG tablet Take 1 tablet (10 mg total) by mouth 2 (two) times daily with a meal. 10/18/16   Weber, Dema Severin, PA-C  clonazePAM (KLONOPIN) 0.5 MG tablet Take 0.5 tablets (0.25 mg total) by mouth at bedtime as needed for anxiety. 10/18/16   Weber, Dema Severin, PA-C  HYDROcodone-acetaminophen (NORCO/VICODIN) 5-325 MG tablet Take 1-2 tablets by mouth every 6 (six) hours as needed for moderate pain. 12/18/16   Vanetta Mulders, MD  ibuprofen (ADVIL,MOTRIN) 800 MG tablet Take 1 tablet (800 mg total) by mouth every 8 (eight) hours as needed. 05/09/16   Lawyer, Cristal Deer, PA-C      Allergies    Wellbutrin [bupropion]    Review of Systems   Review of Systems  Constitutional:  Negative for fever.  Gastrointestinal:  Positive for abdominal pain.    Physical  Exam Updated Vital Signs BP (!) 149/98 (BP Location: Right Arm)   Pulse 69   Temp 98.3 F (36.8 C) (Oral)   Resp 17   Ht 5\' 9"  (1.753 m)   Wt 83.9 kg   SpO2 98%   BMI 27.32 kg/m  Physical Exam Vitals and nursing note reviewed.  Constitutional:      General: He is not in acute distress.    Appearance: He is well-developed.  HENT:     Head: Normocephalic and atraumatic.  Eyes:     Conjunctiva/sclera: Conjunctivae normal.  Cardiovascular:     Rate and Rhythm: Normal rate and regular rhythm.     Heart sounds: No murmur heard. Pulmonary:     Effort: Pulmonary effort is normal. No respiratory distress.     Breath sounds: Normal breath sounds.  Abdominal:     Palpations: Abdomen is soft.     Tenderness: There is abdominal tenderness in the right lower quadrant.     Comments: +rubbery mass on RLQ  Musculoskeletal:        General: No swelling.     Cervical back: Neck supple.  Skin:    General: Skin is warm and dry.     Capillary Refill: Capillary refill takes less than 2 seconds.  Neurological:     Mental Status: He is alert.  Psychiatric:        Mood and Affect: Mood normal.  ED Results / Procedures / Treatments   Labs (all labs ordered are listed, but only abnormal results are displayed) Labs Reviewed  URINALYSIS, ROUTINE W REFLEX MICROSCOPIC - Abnormal; Notable for the following components:      Result Value   Color, Urine STRAW (*)    All other components within normal limits  BASIC METABOLIC PANEL - Abnormal; Notable for the following components:   Glucose, Bld 108 (*)    All other components within normal limits  CBC    EKG None  Radiology CT ABDOMEN PELVIS W CONTRAST Result Date: 10/27/2023 CLINICAL DATA:  Right lower quadrant pain EXAM: CT ABDOMEN AND PELVIS WITH CONTRAST TECHNIQUE: Multidetector CT imaging of the abdomen and pelvis was performed using the standard protocol following bolus administration of intravenous contrast. RADIATION DOSE  REDUCTION: This exam was performed according to the departmental dose-optimization program which includes automated exposure control, adjustment of the mA and/or kV according to patient size and/or use of iterative reconstruction technique. CONTRAST:  OMNIPAQUE IOHEXOL 300 MG/ML  SOLN COMPARISON:  CT abdomen and pelvis 12/14/2010 FINDINGS: Lower chest: No acute abnormality. Hepatobiliary: There is a subcentimeter hypodensity in the right lobe of the liver image 2/19 which is too Jasimine Simms to characterize. Otherwise, the liver, gallbladder and bile ducts are within normal limits. Pancreas: Unremarkable. No pancreatic ductal dilatation or surrounding inflammatory changes. Spleen: Normal in size without focal abnormality. Adrenals/Urinary Tract: Adrenal glands are unremarkable. Kidneys are normal, without renal calculi, focal lesion, or hydronephrosis. Bladder is unremarkable. Stomach/Bowel: Stomach is within normal limits. Appendix appears normal. No evidence of bowel wall thickening, distention, or inflammatory changes. Vascular/Lymphatic: No significant vascular findings are present. No enlarged abdominal or pelvic lymph nodes. Reproductive: Prostate is unremarkable. Other: No abdominal wall hernia or abnormality. No abdominopelvic ascites. Musculoskeletal: No acute or significant osseous findings. IMPRESSION: 1. No acute localizing process in the abdomen or pelvis. 2. Subcentimeter hypodensity in the right lobe of the liver is too Chastin Garlitz to characterize, possibly a cyst or hemangioma. Electronically Signed   By: Darliss Cheney M.D.   On: 10/27/2023 19:46    Procedures Procedures    Medications Ordered in ED Medications  acetaminophen (TYLENOL) tablet 1,000 mg (1,000 mg Oral Patient Refused/Not Given 10/27/23 1758)  iohexol (OMNIPAQUE) 300 MG/ML solution 100 mL (100 mLs Intravenous Contrast Given 10/27/23 1917)    ED Course/ Medical Decision Making/ A&P                                 Medical Decision  Making Patient is a 32 year old male, here for right lower quadrant/flank pain, this been going on for the last month, worse for the last week.  He is overall well-appearing, and has minimal tenderness to palpation on exam.  He has a rubbery mass on the right lower quadrant, that appears to be deep.  Will obtain CT abdomen pelvis, for further evaluation.  Denies any testicular pain.  No concern for STDs per patient.  Amount and/or Complexity of Data Reviewed Labs: ordered.    Details: Blood work unremarkable Radiology: ordered.    Details: CT ab pelvis shows no acute findings other than a hypodensity in the right lobe of the liver that is too Atlas Kuc to characterize.   Discussion of management or test interpretation with external provider(s): Patient is a well-appearing 33 year old male, here for right lower quadrant/right flank pain, that has been going on for about a month, got  worse this week.  He has no guarding on exam, CT abdomen pelvis, obtain, given patient's insistence, and concern, there is no evidence of any kind of masses, except for a Jaleeyah Munce hypodensity in the right lobe of the liver.  He was informed that he should follow-up with his PCP, for further evaluation and imaging.  He voiced understanding, was discharged home.  No testicular pain, thus I think torsion is very unlikely  Risk OTC drugs. Prescription drug management.    Final Clinical Impression(s) / ED Diagnoses Final diagnoses:  Right flank pain    Rx / DC Orders ED Discharge Orders     None         Cortni Tays, Harley Alto, PA 10/27/23 2016    Ernie Avena, MD 10/27/23 2034

## 2023-11-22 ENCOUNTER — Ambulatory Visit: Payer: Self-pay | Admitting: Family

## 2023-11-22 ENCOUNTER — Encounter: Payer: Self-pay | Admitting: Family

## 2023-11-22 VITALS — BP 118/74 | HR 81 | Temp 97.6°F | Resp 19 | Ht 69.0 in | Wt 188.6 lb

## 2023-11-22 DIAGNOSIS — Z113 Encounter for screening for infections with a predominantly sexual mode of transmission: Secondary | ICD-10-CM | POA: Diagnosis not present

## 2023-11-22 DIAGNOSIS — R160 Hepatomegaly, not elsewhere classified: Secondary | ICD-10-CM

## 2023-11-22 DIAGNOSIS — Z7689 Persons encountering health services in other specified circumstances: Secondary | ICD-10-CM

## 2023-11-22 DIAGNOSIS — J4520 Mild intermittent asthma, uncomplicated: Secondary | ICD-10-CM

## 2023-11-22 DIAGNOSIS — Z1159 Encounter for screening for other viral diseases: Secondary | ICD-10-CM

## 2023-11-22 MED ORDER — ALBUTEROL SULFATE HFA 108 (90 BASE) MCG/ACT IN AERS: 2.0000 | INHALATION_SPRAY | Freq: Four times a day (QID) | RESPIRATORY_TRACT | 3 refills | Status: AC | PRN

## 2023-11-23 LAB — HEPATITIS C ANTIBODY: Hepatitis C Ab: NONREACTIVE

## 2023-11-23 LAB — HIV ANTIBODY (ROUTINE TESTING W REFLEX): HIV 1&2 Ab, 4th Generation: NONREACTIVE

## 2023-11-25 ENCOUNTER — Ambulatory Visit (INDEPENDENT_AMBULATORY_CARE_PROVIDER_SITE_OTHER): Admitting: Gastroenterology

## 2023-11-25 ENCOUNTER — Encounter (INDEPENDENT_AMBULATORY_CARE_PROVIDER_SITE_OTHER): Payer: Self-pay | Admitting: Gastroenterology

## 2023-11-25 ENCOUNTER — Telehealth: Payer: Self-pay | Admitting: Family

## 2023-11-25 ENCOUNTER — Encounter (INDEPENDENT_AMBULATORY_CARE_PROVIDER_SITE_OTHER): Payer: Self-pay | Admitting: *Deleted

## 2023-11-25 VITALS — BP 143/86 | HR 82 | Temp 98.1°F | Ht 69.0 in | Wt 187.3 lb

## 2023-11-25 DIAGNOSIS — K921 Melena: Secondary | ICD-10-CM | POA: Diagnosis not present

## 2023-11-25 DIAGNOSIS — R1031 Right lower quadrant pain: Secondary | ICD-10-CM

## 2023-11-25 DIAGNOSIS — R748 Abnormal levels of other serum enzymes: Secondary | ICD-10-CM | POA: Diagnosis not present

## 2023-11-25 DIAGNOSIS — Z800 Family history of malignant neoplasm of digestive organs: Secondary | ICD-10-CM | POA: Insufficient documentation

## 2023-11-25 DIAGNOSIS — K769 Liver disease, unspecified: Secondary | ICD-10-CM | POA: Insufficient documentation

## 2023-11-25 MED ORDER — HYOSCYAMINE SULFATE 0.125 MG SL SUBL: 0.1250 mg | SUBLINGUAL_TABLET | Freq: Four times a day (QID) | SUBLINGUAL | 2 refills | Status: AC | PRN

## 2023-11-25 MED ORDER — METAMUCIL SMOOTH TEXTURE 58.6 % PO POWD: 1.0000 | Freq: Two times a day (BID) | ORAL | 2 refills | Status: AC

## 2023-11-25 MED ORDER — PEG 3350-KCL-NA BICARB-NACL 420 G PO SOLR: 4000.0000 mL | Freq: Once | ORAL | 0 refills | Status: AC

## 2023-11-25 NOTE — Progress Notes (Signed)
 [name] , M.D. Gastroenterology & Hepatology [facility] Gastroenterology [address] [place], [place] [address] Primary Care Physician: Caesar Bookman, NP [address] [place] [address]  Chief Complaint: Abdominal pain and liver lesion, hematochezia   History of Present Illness: Joseph Cordova is a 33 y.o. male with no significant medical problem who presents for evaluation of Abdominal pain and liver lesion, hematochezia .  Patient reports that for the past few months he is having right lower quadrant abdominal discomfort which moves around to right upper and midepigastric area.  No association with food or defecation.  Patient has noticed intermittently fresh blood upon wiping.  Reports history of stool scale 3-5 bowel movement daily without straining The patient denies having any nausea, vomiting, fever, chills, melena, hematemesis, abdominal distention, abdominal pain, diarrhea, jaundice, pruritus or weight loss.  Last YNW:GNFA Last Colonoscopy:non  FHx: Father had primary liver cancer Social: neg smoking, occasional EtOH user Surgical: no abdominal surgeries  Labs with hemoglobin 16 platelet 382.  Normal liver enzymes and profile  Hep C negative HIV negative  Past Medical History: Past Medical History:  Diagnosis Date   Allergy    Anxiety    Arthritis    History of migraine headaches    Rocky Mountain spotted fever     Past Surgical History: Past Surgical History:  Procedure Laterality Date   TONSILLECTOMY     WISDOM TOOTH EXTRACTION      Family History: Family History  Problem Relation Age of Onset   Diabetes Mother    Hyperlipidemia Mother    Hypertension Mother    Liver cancer Father    Diabetes Maternal Aunt    Cancer Maternal Grandmother    Diabetes Maternal Grandmother    Hypertension Maternal Grandmother    Hypertension Maternal Grandfather    Hyperlipidemia Maternal Grandfather    Diabetes Maternal  Grandfather    Cancer Paternal Grandmother    Cancer Paternal Grandfather     Social History: Social History   Tobacco Use  Smoking Status Former   Current packs/day: 0.50   Types: Cigarettes   Passive exposure: Past  Smokeless Tobacco Former   Types: Snuff   Social History   Substance and Sexual Activity  Alcohol Use Yes   Comment: occ   Social History   Substance and Sexual Activity  Drug Use No    Allergies: Allergies  Allergen Reactions   Wellbutrin [Bupropion]     angry    Medications: Current Outpatient Medications  Medication Sig Dispense Refill   acetaminophen (TYLENOL) 500 MG tablet Take 500 mg by mouth every 8 (eight) hours as needed. FOR ARTHRITIS     albuterol (VENTOLIN HFA) 108 (90 Base) MCG/ACT inhaler Inhale 2 puffs into the lungs every 6 (six) hours as needed for wheezing or shortness of breath. 8 g 3   ibuprofen (ADVIL,MOTRIN) 800 MG tablet Take 1 tablet (800 mg total) by mouth every 8 (eight) hours as needed. 21 tablet 0   Multiple Vitamin (MULTIVITAMIN) tablet Take 1 tablet by mouth daily.     No current facility-administered medications for this visit.    Review of Systems: GENERAL: negative for malaise, night sweats HEENT: No changes in hearing or vision, no nose bleeds or other nasal problems. NECK: Negative for lumps, goiter, pain and significant neck swelling RESPIRATORY: Negative for cough, wheezing CARDIOVASCULAR: Negative for chest pain, leg swelling, palpitations, orthopnea GI: SEE HPI MUSCULOSKELETAL: Negative for joint pain or swelling, back pain,  and muscle pain. SKIN: Negative for lesions, rash HEMATOLOGY Negative for prolonged bleeding, bruising easily, and swollen nodes. ENDOCRINE: Negative for cold or heat intolerance, polyuria, polydipsia and goiter. NEURO: negative for tremor, gait imbalance, syncope and seizures. The remainder of the review of systems is noncontributory.   Physical Exam: BP (!) 143/86   Pulse 82    Temp 98.1 F (36.7 C) (Oral)   Ht 5\' 9"  (1.753 m)   Wt 187 lb 4.8 oz (85 kg)   BMI 27.66 kg/m  GENERAL: The patient is AO x3, in no acute distress. HEENT: Head is normocephalic and atraumatic. EOMI are intact. Mouth is well hydrated and without lesions. NECK: Supple. No masses LUNGS: Clear to auscultation. No presence of rhonchi/wheezing/rales. Adequate chest expansion HEART: RRR, normal s1 and s2. ABDOMEN: Soft, nontender, no guarding, no peritoneal signs, and nondistended. BS +. No masses.  Imaging/Labs:  CT abdomen and pelvis 10/2023  IMPRESSION: 1. No acute localizing process in the abdomen or pelvis. 2. Subcentimeter hypodensity in the right lobe of the liver is too small to characterize, possibly a cyst or hemangioma.  2012  IMPRESSION:  Unremarkable CT of the abdomen and pelvis.      Latest Ref Rng & Units 10/27/2023    3:06 PM 07/20/2015    7:45 PM 12/13/2010    3:40 PM  CBC  WBC 4.0 - 10.5 K/uL 6.6  10.8  6.2   Hemoglobin 13.0 - 17.0 g/dL 16.1  09.6  04.5   Hematocrit 39.0 - 52.0 % 46.8  41.4  43.8   Platelets 150 - 400 K/uL 382   304    No results found for: "IRON", "TIBC", "FERRITIN"  I personally reviewed and interpreted the available labs, imaging and endoscopic files.  Impression and Plan:  Joseph Cordova is a 33 y.o. male with no significant medical problem who presents for evaluation of Abdominal pain and liver lesion, hematochezia .  #Liver lesion  Patient has a subcentimeter liver lesion which is likely hemangioma simple cyst.  Although patient father had primary liver cancer which puts him at high risk.  Patient himself clinically does not appear to have any chronic liver disease.  Will obtain MRI abdomen with and without contrast to evaluate nature of this liver lesion CMP, hepatitis B profile and AFP  If liver enzymes are found to be elevated we will plan on sending chronic liver workup at that time  #Abdominal pain  #Hematochezia  Patient has  recurrent right lower quadrant abdominal pain which could be due to constipation or IBS.  Although given reports of painless hematochezia I cannot rule out inflammatory bowel disease or polyp without colonoscopy.  Most likely cause hematochezia is hemorrhoidal bleed but colonoscopy is indicated to rule out any underlying lesion  Patient does appear to have mild right lower quadrant tenderness on exam  Discussed risk-benefit indication of colonoscopy  IBgard 1-2 times daily Metamucil daily Linzess samples given   All questions were answered.      [name], MD Gastroenterology and Hepatology [facility] Gastroenterology   This chart has been completed using [facility] Dictation software, and while attempts have been made to ensure accuracy , certain words and phrases may not be transcribed as intended

## 2023-11-25 NOTE — Telephone Encounter (Signed)
 Copied from CRM [id number]. Topic: Referral - Question >> Nov 22, 2023  1:13 PM Irine Seal wrote: Reason for CRM: The patient called regarding referral [phone]. The GI referral was sent to [facility], but the patient stated it was supposed to go to [facility] on [address], attention [place]. The fax number is [phone].

## 2023-11-25 NOTE — H&P (View-Only) (Signed)
 [name] , M.D. Gastroenterology & Hepatology [facility]enter[facility] [address] [place], [place] [address] Primary Care Physician: Caesar Bookman, NP [address] [place] [address]  Chief Complaint: Abdominal pain and liver lesion, hematochezia   History of Present Illness: Joseph Cordova is a 33 y.o. male with no significant medical problem who presents for evaluation of Abdominal pain and liver lesion, hematochezia .  Patient reports that for the past few months he is having right lower quadrant abdominal discomfort which moves around to right upper and midepigastric area.  No association with food or defecation.  Patient has noticed intermittently fresh blood upon wiping.  Reports history of stool scale 3-5 bowel movement daily without straining The patient denies having any nausea, vomiting, fever, chills, melena, hematemesis, abdominal distention, abdominal pain, diarrhea, jaundice, pruritus or weight loss.  Last YNW:GNFA Last Colonoscopy:non  FHx: Father had primary liver cancer Social: neg smoking, occasional EtOH user Surgical: no abdominal surgeries  Labs with hemoglobin 16 platelet 382.  Normal liver enzymes and profile  Hep C negative HIV negative  Past Medical History: Past Medical History:  Diagnosis Date   Allergy    Anxiety    Arthritis    History of migraine headaches    Rocky Mountain spotted fever     Past Surgical History: Past Surgical History:  Procedure Laterality Date   TONSILLECTOMY     WISDOM TOOTH EXTRACTION      Family History: Family History  Problem Relation Age of Onset   Diabetes Mother    Hyperlipidemia Mother    Hypertension Mother    Liver cancer Father    Diabetes Maternal Aunt    Cancer Maternal Grandmother    Diabetes Maternal Grandmother    Hypertension Maternal Grandmother    Hypertension Maternal Grandfather    Hyperlipidemia Maternal Grandfather    Diabetes Maternal  Grandfather    Cancer Paternal Grandmother    Cancer Paternal Grandfather     Social History: Social History   Tobacco Use  Smoking Status Former   Current packs/day: 0.50   Types: Cigarettes   Passive exposure: Past  Smokeless Tobacco Former   Types: Snuff   Social History   Substance and Sexual Activity  Alcohol Use Yes   Comment: occ   Social History   Substance and Sexual Activity  Drug Use No    Allergies: Allergies  Allergen Reactions   Wellbutrin [Bupropion]     angry    Medications: Current Outpatient Medications  Medication Sig Dispense Refill   acetaminophen (TYLENOL) 500 MG tablet Take 500 mg by mouth every 8 (eight) hours as needed. FOR ARTHRITIS     albuterol (VENTOLIN HFA) 108 (90 Base) MCG/ACT inhaler Inhale 2 puffs into the lungs every 6 (six) hours as needed for wheezing or shortness of breath. 8 g 3   ibuprofen (ADVIL,MOTRIN) 800 MG tablet Take 1 tablet (800 mg total) by mouth every 8 (eight) hours as needed. 21 tablet 0   Multiple Vitamin (MULTIVITAMIN) tablet Take 1 tablet by mouth daily.     No current facility-administered medications for this visit.    Review of Systems: GENERAL: negative for malaise, night sweats HEENT: No changes in hearing or vision, no nose bleeds or other nasal problems. NECK: Negative for lumps, goiter, pain and significant neck swelling RESPIRATORY: Negative for cough, wheezing CARDIOVASCULAR: Negative for chest pain, leg swelling, palpitations, orthopnea GI: SEE HPI MUSCULOSKELETAL: Negative for joint pain or swelling, back pain,  and muscle pain. SKIN: Negative for lesions, rash HEMATOLOGY Negative for prolonged bleeding, bruising easily, and swollen nodes. ENDOCRINE: Negative for cold or heat intolerance, polyuria, polydipsia and goiter. NEURO: negative for tremor, gait imbalance, syncope and seizures. The remainder of the review of systems is noncontributory.   Physical Exam: BP (!) 143/86   Pulse 82    Temp 98.1 F (36.7 C) (Oral)   Ht 5\' 9"  (1.753 m)   Wt 187 lb 4.8 oz (85 kg)   BMI 27.66 kg/m  GENERAL: The patient is AO x3, in no acute distress. HEENT: Head is normocephalic and atraumatic. EOMI are intact. Mouth is well hydrated and without lesions. NECK: Supple. No masses LUNGS: Clear to auscultation. No presence of rhonchi/wheezing/rales. Adequate chest expansion HEART: RRR, normal s1 and s2. ABDOMEN: Soft, nontender, no guarding, no peritoneal signs, and nondistended. BS +. No masses.  Imaging/Labs:  CT abdomen and pelvis 10/2023  IMPRESSION: 1. No acute localizing process in the abdomen or pelvis. 2. Subcentimeter hypodensity in the right lobe of the liver is too small to characterize, possibly a cyst or hemangioma.  2012  IMPRESSION:  Unremarkable CT of the abdomen and pelvis.      Latest Ref Rng & Units 10/27/2023    3:06 PM 07/20/2015    7:45 PM 12/13/2010    3:40 PM  CBC  WBC 4.0 - 10.5 K/uL 6.6  10.8  6.2   Hemoglobin 13.0 - 17.0 g/dL 16.1  09.6  04.5   Hematocrit 39.0 - 52.0 % 46.8  41.4  43.8   Platelets 150 - 400 K/uL 382   304    No results found for: "IRON", "TIBC", "FERRITIN"  I personally reviewed and interpreted the available labs, imaging and endoscopic files.  Impression and Plan:  Joseph Cordova is a 33 y.o. male with no significant medical problem who presents for evaluation of Abdominal pain and liver lesion, hematochezia .  #Liver lesion  Patient has a subcentimeter liver lesion which is likely hemangioma simple cyst.  Although patient father had primary liver cancer which puts him at high risk.  Patient himself clinically does not appear to have any chronic liver disease.  Will obtain MRI abdomen with and without contrast to evaluate nature of this liver lesion CMP, hepatitis B profile and AFP  If liver enzymes are found to be elevated we will plan on sending chronic liver workup at that time  #Abdominal pain  #Hematochezia  Patient has  recurrent right lower quadrant abdominal pain which could be due to constipation or IBS.  Although given reports of painless hematochezia I cannot rule out inflammatory bowel disease or polyp without colonoscopy.  Most likely cause hematochezia is hemorrhoidal bleed but colonoscopy is indicated to rule out any underlying lesion  Patient does appear to have mild right lower quadrant tenderness on exam  Discussed risk-benefit indication of colonoscopy  IBgard 1-2 times daily Metamucil daily Linzess samples given   All questions were answered.      [name], MD Gastroenterology and Hepatology [facility] Gastroenterology   This chart has been completed using [facility] Dictation software, and while attempts have been made to ensure accuracy , certain words and phrases may not be transcribed as intended

## 2023-11-25 NOTE — Telephone Encounter (Signed)
 Spoke to pt. Confirmed I have faxed referral to [facility]enter[facility] as requested.

## 2023-11-25 NOTE — Addendum Note (Signed)
 Addended by: Marlowe Shores on: 11/25/2023 02:59 PM   Modules accepted: Orders

## 2023-11-25 NOTE — Patient Instructions (Signed)
 It was very nice to meet you today, as dicussed with will plan for the following :  1) Ensure adequate fluid intake: Aim for 8 glasses of water daily. Follow a high fiber diet: Include foods such as dates, prunes, pears, and kiwi. Use Metamucil daily  2)MRI and blood work  3) Colonoscopy   4) IB Guard 1-2 times daily

## 2023-11-26 ENCOUNTER — Telehealth (INDEPENDENT_AMBULATORY_CARE_PROVIDER_SITE_OTHER): Payer: Self-pay | Admitting: Gastroenterology

## 2023-11-26 LAB — COMPREHENSIVE METABOLIC PANEL
ALT: 22 IU/L (ref 0–44)
AST: 15 IU/L (ref 0–40)
Albumin: 4.7 g/dL (ref 4.1–5.1)
Alkaline Phosphatase: 72 IU/L (ref 44–121)
BUN/Creatinine Ratio: 10 (ref 9–20)
BUN: 12 mg/dL (ref 6–20)
Bilirubin Total: 0.5 mg/dL (ref 0.0–1.2)
CO2: 25 mmol/L (ref 20–29)
Calcium: 10 mg/dL (ref 8.7–10.2)
Chloride: 101 mmol/L (ref 96–106)
Creatinine, Ser: 1.22 mg/dL (ref 0.76–1.27)
Globulin, Total: 2.5 g/dL (ref 1.5–4.5)
Glucose: 74 mg/dL (ref 70–99)
Potassium: 4.8 mmol/L (ref 3.5–5.2)
Sodium: 140 mmol/L (ref 134–144)
Total Protein: 7.2 g/dL (ref 6.0–8.5)
eGFR: 80 mL/min/{1.73_m2} (ref >59)

## 2023-11-26 LAB — HEPATITIS B SURFACE ANTIGEN: Hepatitis B Surface Ag: NEGATIVE

## 2023-11-26 LAB — CELIAC AB TTG DGP TIGA
Antigliadin Abs, IgA: 4 U (ref 0–19)
Gliadin IgG: 3 U (ref 0–19)
IgA/Immunoglobulin A, Serum: 187 mg/dL (ref 90–386)
Tissue Transglut Ab: 4 U/mL (ref 0–5)
Transglutaminase IgA: <2 U/mL (ref 0–3)

## 2023-11-26 LAB — AFP TUMOR MARKER: AFP, Serum, Tumor Marker: 3.1 ng/mL (ref 0.0–6.9)

## 2023-11-26 LAB — HEPATITIS B CORE ANTIBODY, TOTAL: Hep B Core Total Ab: NEGATIVE

## 2023-11-26 LAB — HEPATITIS B SURFACE ANTIBODY,QUALITATIVE: Hep B Surface Ab, Qual: REACTIVE

## 2023-11-26 NOTE — Telephone Encounter (Signed)
 MRI approved via [employer]. Only approved for [facility] in [place]. MRI order printed and placed on provider desk for signature. Pt contacted and verbalized understanding.

## 2023-11-27 ENCOUNTER — Encounter (INDEPENDENT_AMBULATORY_CARE_PROVIDER_SITE_OTHER): Payer: Self-pay | Admitting: Gastroenterology

## 2023-11-27 NOTE — Progress Notes (Signed)
 Labs reviewed  ============== Normal liver enzymes Hepatitis B nonexposed immune AFP 3.1 (normal) Negative celiac serologies  Awaiting MRI

## 2023-11-28 NOTE — Telephone Encounter (Signed)
 PA approval and order faxed to [facility]

## 2023-12-01 NOTE — Progress Notes (Signed)
 Provider: Richarda Blade FNP-C   Sherrill Buikema, Donalee Citrin, NP  Patient Care Team: Demari Kropp, Donalee Citrin, NP as PCP - General (Family Medicine)  Extended Emergency Contact Information Primary Emergency Contact: [facility] Address: [address]          [place], [place] [address] [place] of [place] Phone: [phone] Relation: Father Secondary Emergency Contact: Fennelly,Autumn Address: [address] [place], [place] [address] [place] of [place] Phone: [phone] Relation: Spouse Mother: Laretta Alstrom Address: [address]          [place], [place] [address] [place] Home Phone: [phone]  Code Status:  Full Code  Goals of care: Advanced Directive information    11/22/2023    9:24 AM  Advanced Directives  Does Patient Have a Medical Advance Directive? No  Would patient like information on creating a medical advance directive? No - Patient declined     Chief Complaint  Patient presents with   Establish Care    New patient appointment.     Discussed the use of AI scribe software for clinical note transcription with the patient, who gave verbal consent to proceed.  History of Present Illness   Joseph Cordova is a 33 year old male New patient here to establish care. Has a medical history of Asthma, ADHA, Generalized Anxiety disorder among others. He presents with right flank pain and a liver spot for evaluation. He was referred for evaluation of a liver spot identified on a CT scan.  He has been experiencing right flank pain for approximately a month. The pain originates from the liver area and sometimes radiates to the groin and back. There is tenderness in the right lower quadrant, and a rubbery mass was noted in this area during a previous examination. No current wheezing, acid reflux, abscess history, depression, vision changes, sinus tenderness, or shoulder issues.  A CT scan was performed, revealing a small hypodensity in the right lobe of the liver. He  has a family history of liver cancer, as his father has terminal cancer and a history of liver cancer and hepatitis C.  He has a history of asthma, which is currently mild and managed with over-the-counter allergy medication. He does not use an inhaler regularly but has access to one if needed. His asthma symptoms are influenced by weather and activity.  He has a history of ADHD and generalized anxiety, particularly in social situations. He does not currently take medication for ADHD or anxiety.  He reports a history of insomnia, which he does not currently treat.  He has a past surgical history of tonsillectomy and wisdom teeth removal. He was scheduled for ankle surgery last year due to torn tendons but canceled it after steroid treatment reduced the swelling. He experiences intermittent pain in the left ankle but manages it with supportive footwear.  Socially, he drinks alcohol socially, consuming two to three drinks on occasion. He quit smoking in 2018 after smoking two packs a day since the age of [age]. 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 He reports a healthy diet rich in fruits, vegetables, and meats.   Past Medical History:  Diagnosis Date   Allergy    Anxiety    Arthritis    History of migraine headaches    Rocky Mountain spotted fever    Past Surgical History:  Procedure Laterality Date   TONSILLECTOMY  WISDOM TOOTH EXTRACTION      Allergies  Allergen Reactions   Wellbutrin [Bupropion]     angry    Allergies as of 11/22/2023       Reactions   Wellbutrin [bupropion]    angry        Medication List        Accurate as of November 22, 2023 11:59 PM. If you have any questions, ask your nurse or doctor.          STOP taking these medications    amphetamine-dextroamphetamine 10 MG tablet Commonly known as: Adderall Stopped by: Donalee Citrin Jaiah Weigel   clonazePAM 0.5 MG tablet Commonly known as: KLONOPIN Stopped by:  Donalee Citrin Shriyans Kuenzi   HYDROcodone-acetaminophen 5-325 MG tablet Commonly known as: NORCO/VICODIN Stopped by: Donalee Citrin Koleson Reifsteck       TAKE these medications    acetaminophen 500 MG tablet Commonly known as: TYLENOL Take 500 mg by mouth every 8 (eight) hours as needed. FOR ARTHRITIS   albuterol 108 (90 Base) MCG/ACT inhaler Commonly known as: VENTOLIN HFA Inhale 2 puffs into the lungs every 6 (six) hours as needed for wheezing or shortness of breath. Started by: Donalee Citrin Yorel Redder   ibuprofen 800 MG tablet Commonly known as: ADVIL Take 1 tablet (800 mg total) by mouth every 8 (eight) hours as needed.        Review of Systems  Constitutional:  Negative for appetite change, chills, fatigue, fever and unexpected weight change.  HENT:  Negative for congestion, dental problem, ear discharge, ear pain, facial swelling, hearing loss, nosebleeds, postnasal drip, rhinorrhea, sinus pressure, sinus pain, sneezing, sore throat, tinnitus and trouble swallowing.   Eyes:  Negative for pain, discharge, redness, itching and visual disturbance.  Respiratory:  Negative for cough, chest tightness, shortness of breath and wheezing.   Cardiovascular:  Negative for chest pain, palpitations and leg swelling.  Gastrointestinal:  Negative for abdominal distention, abdominal pain, blood in stool, constipation, diarrhea, nausea and vomiting.  Endocrine: Negative for cold intolerance, heat intolerance, polydipsia, polyphagia and polyuria.  Genitourinary:  Negative for difficulty urinating, dysuria, flank pain, frequency and urgency.  Musculoskeletal:  Positive for back pain. Negative for arthralgias, gait problem, joint swelling, myalgias, neck pain and neck stiffness.       Right flank pain   Skin:  Negative for color change, pallor, rash and wound.  Neurological:  Negative for dizziness, syncope, speech difficulty, weakness, light-headedness, numbness and headaches.  Hematological:  Does not bruise/bleed easily.   Psychiatric/Behavioral:  Negative for agitation, behavioral problems, confusion, hallucinations, self-injury, sleep disturbance and suicidal ideas. The patient is nervous/anxious.     Immunization History  Administered Date(s) Administered   Tdap 07/12/2016   Pertinent  Health Maintenance Due  Topic Date Due   INFLUENZA VACCINE  12/02/2023 (Originally 04/04/2023)      03/21/2016    4:31 PM  Fall Risk  Falls in the past year? No   Functional Status Survey:    Vitals:   11/22/23 0934  BP: 118/74  Pulse: 81  Resp: 19  Temp: 97.6 F (36.4 C)  SpO2: 97%  Weight: 188 lb 9.6 oz (85.5 kg)  Height: 5\' 9"  (1.753 m)   Body mass index is 27.85 kg/m. Physical Exam GENERAL: Alert, cooperative, well developed, no acute distress. HEENT: Normocephalic, normal oropharynx, moist mucous membranes, ears normal, tympanic membranes normal, nose normal, oral cavity normal, tongue normal, vision grossly intact, no sinus tenderness. CHEST: Clear to auscultation bilaterally, no wheezes, rhonchi, or crackles,  no tenderness on back or right side. CARDIOVASCULAR: Normal heart rate and rhythm, S1 and S2 normal without murmurs, heart sounds normal. ABDOMEN: Soft, non-tender, non-distended, without organomegaly, normal bowel sounds, pressure on right side of abdomen, no splenic tenderness. EXTREMITIES: No cyanosis or edema, no leg swelling. MUSCULOSKELETAL: Hips non-tender, full range of motion. NEUROLOGICAL: Cranial nerves grossly intact, moves all extremities without gross motor or sensory deficit. PSYCHIATRY/BEHAVIORAL: Mood stable   Labs reviewed: Recent Labs    10/27/23 1506 11/25/23 1437  NA 139 140  K 4.0 4.8  CL 104 101  CO2 27 25  GLUCOSE 108* 74  BUN 13 12  CREATININE 1.11 1.22  CALCIUM 9.2 10.0   Recent Labs    11/25/23 1437  AST 15  ALT 22  ALKPHOS 72  BILITOT 0.5  PROT 7.2  ALBUMIN 4.7   Recent Labs    10/27/23 1506  WBC 6.6  HGB 16.0  HCT 46.8  MCV 89.8  PLT 382    No results found for: "TSH" No results found for: "HGBA1C" No results found for: "CHOL", "HDL", "LDLCALC", "LDLDIRECT", "TRIG", "CHOLHDL"  Significant Diagnostic Results in last 30 days:  No results found.  Assessment/Plan    Right Flank Pain Presents with right flank pain radiating to the groin and back. CT scan reveals a small hypodensity in the right lobe of the liver, with no mass or appendicitis. Differential includes liver-related issues or musculoskeletal pain. Family history of liver cancer necessitates further evaluation. - Refer to [facility]ology at [facility] for further evaluation of the liver hypodensity.  Asthma Mild asthma managed with over-the-counter allergy medication. Does not use a regular asthma inhaler but has access to one at home if needed. Symptoms are mild, triggered by weather changes and activity. - Prescribe albuterol inhaler to be sent to [facility] on [address] in [place].  Generalized Anxiety Disorder Generalized anxiety, particularly in social situations with large crowds. Does not currently take medication for anxiety.  ADHD ADHD diagnosed in adulthood, likely present since childhood. Not currently taking medication for ADHD.  General Health Maintenance Generally healthy with a history of smoking cessation and social alcohol use. Family history of diabetes, cancer, and hypertension. Engages in regular physical activity through work and daily life. Has not received the COVID-19 or flu vaccines. - Perform fasting blood work including screening for hepatitis C and HIV. - Encourage a balanced diet with reduced carbohydrates and increased vegetables and fruits. - Advise on the benefits of regular exercise and maintaining a healthy lifestyle. - Discuss the importance of regular dental check-ups and finding a dentist through insurance. - Encourage smoking cessation and moderation of alcohol intake.   Family/ staff Communication: Reviewed plan  of care with patient verbalized understanding  Labs/tests ordered:  - Hep C - HIV   Next Appointment : Return in about 1 year (around 11/21/2024) for annual Physical examination.   Spent 45 minutes of Face to face and non-face to face with patient  >50% time spent counseling; reviewing medical record; tests; labs; documentation and developing future plan of care.  Caesar Bookman, NP

## 2023-12-09 ENCOUNTER — Encounter (HOSPITAL_COMMUNITY): Payer: Self-pay | Admitting: Gastroenterology

## 2023-12-09 ENCOUNTER — Encounter (HOSPITAL_COMMUNITY)
Admission: RE | Disposition: A | Discharge status: Home / Self Care | Payer: Self-pay | Source: Home / Self Care | Attending: Gastroenterology

## 2023-12-09 ENCOUNTER — Ambulatory Visit (HOSPITAL_COMMUNITY): Admitting: Anesthesiology

## 2023-12-09 ENCOUNTER — Ambulatory Visit (HOSPITAL_COMMUNITY)
Admission: RE | Admit: 2023-12-09 | Discharge: 2023-12-09 | Disposition: A | Discharge status: Home / Self Care | Attending: Gastroenterology | Admitting: Gastroenterology

## 2023-12-09 ENCOUNTER — Encounter (INDEPENDENT_AMBULATORY_CARE_PROVIDER_SITE_OTHER): Payer: Self-pay | Admitting: *Deleted

## 2023-12-09 ENCOUNTER — Other Ambulatory Visit: Payer: Self-pay

## 2023-12-09 DIAGNOSIS — K648 Other hemorrhoids: Secondary | ICD-10-CM | POA: Diagnosis not present

## 2023-12-09 DIAGNOSIS — K621 Rectal polyp: Secondary | ICD-10-CM | POA: Diagnosis not present

## 2023-12-09 DIAGNOSIS — D128 Benign neoplasm of rectum: Secondary | ICD-10-CM | POA: Diagnosis not present

## 2023-12-09 DIAGNOSIS — Z87891 Personal history of nicotine dependence: Secondary | ICD-10-CM | POA: Insufficient documentation

## 2023-12-09 DIAGNOSIS — K5730 Diverticulosis of large intestine without perforation or abscess without bleeding: Secondary | ICD-10-CM | POA: Insufficient documentation

## 2023-12-09 DIAGNOSIS — K7689 Other specified diseases of liver: Secondary | ICD-10-CM | POA: Diagnosis not present

## 2023-12-09 DIAGNOSIS — K6389 Other specified diseases of intestine: Secondary | ICD-10-CM | POA: Diagnosis not present

## 2023-12-09 DIAGNOSIS — D123 Benign neoplasm of transverse colon: Secondary | ICD-10-CM | POA: Insufficient documentation

## 2023-12-09 DIAGNOSIS — Z8719 Personal history of other diseases of the digestive system: Secondary | ICD-10-CM

## 2023-12-09 DIAGNOSIS — R1031 Right lower quadrant pain: Secondary | ICD-10-CM | POA: Insufficient documentation

## 2023-12-09 DIAGNOSIS — M1990 Unspecified osteoarthritis, unspecified site: Secondary | ICD-10-CM | POA: Diagnosis not present

## 2023-12-09 DIAGNOSIS — K635 Polyp of colon: Secondary | ICD-10-CM | POA: Diagnosis not present

## 2023-12-09 DIAGNOSIS — K921 Melena: Secondary | ICD-10-CM | POA: Diagnosis not present

## 2023-12-09 DIAGNOSIS — Z800 Family history of malignant neoplasm of digestive organs: Secondary | ICD-10-CM | POA: Insufficient documentation

## 2023-12-09 DIAGNOSIS — F419 Anxiety disorder, unspecified: Secondary | ICD-10-CM | POA: Diagnosis not present

## 2023-12-09 HISTORY — PX: COLONOSCOPY: SHX5424

## 2023-12-09 HISTORY — DX: Unspecified asthma, uncomplicated: J45.909

## 2023-12-09 LAB — HM COLONOSCOPY

## 2023-12-09 SURGERY — COLONOSCOPY
Anesthesia: General

## 2023-12-09 MED ORDER — PROPOFOL 10 MG/ML IV BOLUS
INTRAVENOUS | Status: DC | PRN
  Administered 2023-12-09: 100 mg via INTRAVENOUS

## 2023-12-09 MED ORDER — LACTATED RINGERS IV SOLN: INTRAVENOUS | Status: DC | PRN

## 2023-12-09 MED ORDER — PROPOFOL 500 MG/50ML IV EMUL
INTRAVENOUS | Status: DC | PRN
Start: 2023-12-09 — End: 2023-12-09
  Administered 2023-12-09: 200 ug/kg/min via INTRAVENOUS

## 2023-12-09 MED ORDER — POLYETHYLENE GLYCOL 3350 17 GM/SCOOP PO POWD: 8.5000 g | Freq: Two times a day (BID) | ORAL | 0 refills | Status: AC

## 2023-12-09 NOTE — Discharge Instructions (Signed)
  Discharge instructions Please read the instructions outlined below and refer to this sheet in the next few weeks. These discharge instructions provide you with general information on caring for yourself after you leave the hospital. Your doctor may also give you specific instructions. While your treatment has been planned according to the most current medical practices available, unavoidable complications occasionally occur. If you have any problems or questions after discharge, please call your doctor. ACTIVITY You may resume your regular activity but move at a slower pace for the next 24 hours.  Take frequent rest periods for the next 24 hours.  Walking will help expel (get rid of) the air and reduce the bloated feeling in your abdomen.  No driving for 24 hours (because of the anesthesia (medicine) used during the test).  You may shower.  Do not sign any important legal documents or operate any machinery for 24 hours (because of the anesthesia used during the test).  NUTRITION Drink plenty of fluids.  You may resume your normal diet.  Begin with a light meal and progress to your normal diet.  Avoid alcoholic beverages for 24 hours or as instructed by your caregiver.  MEDICATIONS You may resume your normal medications unless your caregiver tells you otherwise.  WHAT YOU CAN EXPECT TODAY You may experience abdominal discomfort such as a feeling of fullness or "gas" pains.  FOLLOW-UP Your doctor will discuss the results of your test with you.  SEEK IMMEDIATE MEDICAL ATTENTION IF ANY OF THE FOLLOWING OCCUR: Excessive nausea (feeling sick to your stomach) and/or vomiting.  Severe abdominal pain and distention (swelling).  Trouble swallowing.  Temperature over 101 F (37.8 C).  Rectal bleeding or vomiting of blood.    Avoid using high dose aspirin including Goody/BC powders, NSAIDs such as Aleve, ibuprofen, naproxen, Motrin, Voltaren or Advil (even the topical ones) for 14 days  Take  Miralax twice daily for 10 days     I hope you have a great rest of your week!   [name] , M.D.. Gastroenterology and Hepatology [facility]

## 2023-12-09 NOTE — Anesthesia Preprocedure Evaluation (Addendum)
 Anesthesia Evaluation  Patient identified by MRN, date of birth, ID band Patient awake    Reviewed: Allergy & Precautions, H&P , NPO status , Patient's Chart, lab work & pertinent test results, reviewed documented beta blocker date and time   Airway Mallampati: II  TM Distance: >3 FB Neck ROM: full    Dental no notable dental hx. (+) Dental Advisory Given, Teeth Intact   Pulmonary former smoker   Pulmonary exam normal breath sounds clear to auscultation       Cardiovascular Exercise Tolerance: Good negative cardio ROS Normal cardiovascular exam Rhythm:regular Rate:Normal     Neuro/Psych  Headaches PSYCHIATRIC DISORDERS Anxiety        GI/Hepatic negative GI ROS, Neg liver ROS,,,  Endo/Other  negative endocrine ROS    Renal/GU negative Renal ROS  negative genitourinary   Musculoskeletal  (+) Arthritis , Osteoarthritis,    Abdominal   Peds  Hematology negative hematology ROS (+)   Anesthesia Other Findings History Rocky Mountain Spotted Fever  Reproductive/Obstetrics negative OB ROS                             Anesthesia Physical Anesthesia Plan  ASA: 2  Anesthesia Plan: General   Post-op Pain Management: Minimal or no pain anticipated   Induction: Intravenous  PONV Risk Score and Plan: Propofol infusion  Airway Management Planned: Nasal Cannula and Natural Airway  Additional Equipment: None  Intra-op Plan:   Post-operative Plan:   Informed Consent: I have reviewed the patients History and Physical, chart, labs and discussed the procedure including the risks, benefits and alternatives for the proposed anesthesia with the patient or authorized representative who has indicated his/her understanding and acceptance.     Dental Advisory Given  Plan Discussed with: CRNA  Anesthesia Plan Comments:         Anesthesia Quick Evaluation

## 2023-12-09 NOTE — Interval H&P Note (Signed)
 History and Physical Interval Note:  12/09/2023 12:04 PM  Joseph Cordova  has presented today for surgery, with the diagnosis of HEMATOCHEZIA.  The various methods of treatment have been discussed with the patient and family. After consideration of risks, benefits and other options for treatment, the patient has consented to  Procedure(s) with comments: COLONOSCOPY (N/A) - 1:15PM;ASA 1-2 as a surgical intervention.  The patient's history has been reviewed, patient examined, no change in status, stable for surgery.  I have reviewed the patient's chart and labs.  Questions were answered to the patient's satisfaction.     [name]

## 2023-12-09 NOTE — Transfer of Care (Signed)
 Immediate Anesthesia Transfer of Care Note  Patient: Joseph Cordova  Procedure(s) Performed: COLONOSCOPY  Patient Location: Short Stay  Anesthesia Type:General  Level of Consciousness: awake, alert , oriented, and patient cooperative  Airway & Oxygen Therapy: Patient Spontanous Breathing  Post-op Assessment: Report given to RN, Post -op Vital signs reviewed and stable, and Patient moving all extremities X 4  Post vital signs: Reviewed and stable  Last Vitals:  Vitals Value Taken Time  BP 94/62 12/09/23 1409  Temp 37.1 C 12/09/23 1405  Pulse 58 12/09/23 1409  Resp 20 12/09/23 1409  SpO2 95 % 12/09/23 1409    Last Pain:  Vitals:   12/09/23 1405  TempSrc: Oral  PainSc: 0-No pain      Patients Stated Pain Goal: 5 (12/09/23 1205)  Complications: No notable events documented.

## 2023-12-09 NOTE — Op Note (Signed)
 [facility] Patient Name: Joseph Cordova Procedure Date: 12/09/2023 1:08 PM MRN: [id number] Date of Birth: October 05, 1990 Attending MD: Sanjuan Dame , MD, [id number] CSN: [id number] Age: 33 Admit Type: Outpatient Procedure:                Colonoscopy Indications:              Hematochezia Providers:                Sanjuan Dame, MD, Angelica Ran, Dyann Ruddle Referring MD:              Medicines:                Monitored Anesthesia Care Complications:            No immediate complications. Estimated Blood Loss:     Estimated blood loss: none. Procedure:                Pre-Anesthesia Assessment:                           - Prior to the procedure, a History and Physical                            was performed, and patient medications and                            allergies were reviewed. The patient's tolerance of                            previous anesthesia was also reviewed. The risks                            and benefits of the procedure and the sedation                            options and risks were discussed with the patient.                            All questions were answered, and informed consent                            was obtained. Prior Anticoagulants: The patient has                            taken no anticoagulant or antiplatelet agents. ASA                            Grade Assessment: II - A patient with mild systemic                            disease. After reviewing the risks and benefits,                            the patient was deemed in satisfactory condition to  undergo the procedure.                           After obtaining informed consent, the colonoscope                            was passed under direct vision. Throughout the                            procedure, the patient's blood pressure, pulse, and                            oxygen saturations were monitored continuously. The                             [id number]) scope was introduced through the                            anus and advanced to the the terminal ileum. The                            colonoscopy was performed without difficulty. The                            patient tolerated the procedure well. The quality                            of the bowel preparation was evaluated using the                            BBPS [facility] Bowel Preparation Scale) with scores                            of: Right Colon = 3, Transverse Colon = 3 and Left                            Colon = 3 (entire mucosa seen well with no residual                            staining, small fragments of stool or opaque                            liquid). The total BBPS score equals 9. The                            terminal ileum, ileocecal valve, appendiceal                            orifice, and rectum were photographed. Scope In: 1:25:57 PM Scope Out: 1:59:32 PM Scope Withdrawal Time: 0 hours 31 minutes 32 seconds  Total Procedure Duration: 0 hours 33 minutes 35 seconds  Findings:      An area of mucosa in the terminal ileum was nodular. Biopsies were taken  with a cold forceps for histology.      A 7 mm polyp was found in the transverse colon. The polyp was sessile.       The polyp was removed with a cold snare. Resection and retrieval were       complete.      A few small-mouthed diverticula were found in the left colon.      A 20 mm polyp was found in the rectum. The polyp was sessile.       Preparations were made for mucosal resection. Demarcation of the lesion       was performed with high-definition white light and narrow band imaging       to clearly identify the boundaries of the lesion. Eleview was injected       to raise the lesion. Snare mucosal resection was performed. Resection       and retrieval were complete. Resected tissue margins were examined and       clear of polyp tissue. To close a defect after polypectomy, one        hemostatic clip was successfully placed (MR conditional). Clip       manufacturer: [employer]. There was no bleeding at the end of the       procedure.      Non-bleeding internal hemorrhoids were found during retroflexion. The       hemorrhoids were small. Impression:               - Nodular ileal mucosa. Biopsied.                           - One 7 mm polyp in the transverse colon, removed                            with a cold snare. Resected and retrieved.                           - Diverticulosis in the left colon.                           - One 20 mm polyp in the rectum, removed with                            mucosal resection. Resected and retrieved. Clip (MR                            conditional) was placed. Clip manufacturer: [employer].                           - Non-bleeding internal hemorrhoids.                           - Mucosal resection was performed. Resection and                            retrieval were complete. Moderate Sedation:      Per Anesthesia Care Recommendation:           -  Patient has a contact number available for                            emergencies. The signs and symptoms of potential                            delayed complications were discussed with the                            patient. Return to normal activities tomorrow.                            Written discharge instructions were provided to the                            patient.                           - Resume previous diet.                           - Continue present medications.                           - Await pathology results.                           - Repeat colonoscopy in 3 years for surveillance.                           - Return to GI clinic as previously scheduled.                           -Miralax BID for next 7 days Procedure Code(s):        --- Professional ---                           [phone], Colonoscopy, flexible; with  endoscopic                            mucosal resection                           45385, 59, Colonoscopy, flexible; with removal of                            tumor(s), polyp(s), or other lesion(s) by snare                            technique                           45380, 59, Colonoscopy, flexible; with biopsy,                            single or multiple Diagnosis Code(s):        --- Professional ---  K63.89, Other specified diseases of intestine                           D12.3, Benign neoplasm of transverse colon (hepatic                            flexure or splenic flexure)                           D12.8, Benign neoplasm of rectum                           K64.8, Other hemorrhoids                           K92.1, Melena (includes Hematochezia)                           K57.30, Diverticulosis of large intestine without                            perforation or abscess without bleeding CPT copyright 2022 American Medical Association. All rights reserved. The codes documented in this report are preliminary and upon coder review may  be revised to meet current compliance requirements. Sanjuan Dame, MD Sanjuan Dame, MD 12/09/2023 2:08:11 PM This report has been signed electronically. Number of Addenda: 0

## 2023-12-09 NOTE — Anesthesia Postprocedure Evaluation (Signed)
 Anesthesia Post Note  Patient: Joseph Cordova  Procedure(s) Performed: COLONOSCOPY  Patient location during evaluation: PACU Anesthesia Type: General Level of consciousness: awake and alert Pain management: pain level controlled Vital Signs Assessment: post-procedure vital signs reviewed and stable Respiratory status: spontaneous breathing, nonlabored ventilation, respiratory function stable and patient connected to nasal cannula oxygen Cardiovascular status: blood pressure returned to baseline and stable Postop Assessment: no apparent nausea or vomiting Anesthetic complications: no   There were no known notable events for this encounter.   Last Vitals:  Vitals:   12/09/23 1405 12/09/23 1409  BP: 96/60 94/62  Pulse: 68 (!) 58  Resp: (!) 22 20  Temp: 37.1 C   SpO2: 99% 95%    Last Pain:  Vitals:   12/09/23 1405  TempSrc: Oral  PainSc: 0-No pain                 Zakariyah Freimark L Emberlie Gotcher

## 2023-12-10 ENCOUNTER — Encounter (HOSPITAL_COMMUNITY): Payer: Self-pay | Admitting: Gastroenterology

## 2023-12-10 LAB — SURGICAL PATHOLOGY

## 2023-12-12 NOTE — Progress Notes (Signed)
 I reviewed the pathology results. Ann, can you send her a letter with the findings as described below please?  Repeat colonoscopy in 3 years  Thanks,  [name], MD Gastroenterology and Hepatology [facility]  ---------------------------------------------------------------------------------------------  [facility] [address], [address], [place], [place] [address] Phone:  [phone]   12/12/23 [name], [place]   Dear Joseph Cordova,  I am writing to inform you that the biopsies taken during your recent endoscopic examination showed:  A. TERMINAL ILEUM, ABNORMAL, BIOPSY:       Ileal mucosa with prominent Peyers patch.       Negative for dysplasia or malignancy.   B. COLON, TRANSVERSE, POLYPECTOMY:       Fragments of tubular adenoma.       Negative for high-grade dysplasia.   C. RECTUM, POLYPECTOMY:       Tubular adenoma.       Negative for high-grade dysplasia.    What does this mean?   You had a total of 2 polyps removed. The pathology came back as "tubular adenoma." These findings are NOT cancer, but had the polyps remained in your colon, they could have turned into cancer.  Given these findings, it is recommended that your next colonoscopy be performed in 3 years.  Also I value your feedback , so if you get a survey , please take the time to fill it out and thank you for choosing [facility]  Please call us at [phone] if you have persistent problems or have questions about your condition that have not been fully answered at this time.  Sincerely,  [name], MD Gastroenterology and Hepatology

## 2023-12-13 ENCOUNTER — Encounter (INDEPENDENT_AMBULATORY_CARE_PROVIDER_SITE_OTHER): Payer: Self-pay | Admitting: *Deleted

## 2024-02-12 ENCOUNTER — Encounter (INDEPENDENT_AMBULATORY_CARE_PROVIDER_SITE_OTHER): Payer: Self-pay | Admitting: Gastroenterology

## 2024-07-10 ENCOUNTER — Ambulatory Visit: Admitting: Adult Health

## 2024-07-10 VITALS — BP 116/78 | HR 82 | Temp 98.4°F | Ht 66.0 in | Wt 185.8 lb

## 2024-07-10 DIAGNOSIS — L03011 Cellulitis of right finger: Secondary | ICD-10-CM | POA: Diagnosis not present

## 2024-07-10 MED ORDER — CEPHALEXIN 500 MG PO CAPS: 500.0000 mg | ORAL_CAPSULE | Freq: Four times a day (QID) | ORAL | 0 refills | Status: AC

## 2024-07-10 NOTE — Progress Notes (Signed)
 [facility] clinic  Provider:  Jereld Serum DNP  Code Status:  Full Code  Goals of Care:     12/09/2023   12:01 PM  Advanced Directives  Does Patient Have a Medical Advance Directive? No  Would patient like information on creating a medical advance directive? Yes (MAU/Ambulatory/Procedural Areas - Information given)     Chief Complaint  Patient presents with   Finger Cut    Middle finger right hand. Swollen and can hardly bend. Cut it last week   Discussed the use of AI scribe software for clinical note transcription with the patient, who gave verbal consent to proceed.   HPI: Patient is a 33 y.o. male seen today for an acute visit for right middle finger wound.  The cut on the right middle finger occurred approximately one week ago while working on a boat. The exact cause of the cut is uncertain, but it may have been from a piece of metal or a hose. Initially, the cut was small, described as a 'very small' scratch, but it has since worsened.  Over the past week, the cut has become increasingly painful, tender to touch, and swollen. The pain and redness initially localized to the site of the cut but have since spread up the finger. The swelling is worse upon waking up, and he has difficulty bending the finger due to pain and tenderness.  The condition has impacted his daily activities, such as carrying his kid's bag and performing tasks at work, due to the pain and swelling. He has been using a Band-Aid to cover the cut, which has been uncomfortable during sleep.  He denies any fever and is up to date with his tetanus vaccine. He has no regular medication use and is only allergic to Wellbutrin .  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       Past Medical History:  Diagnosis Date   Allergy    Anxiety    Arthritis    Asthma    History of migraine headaches    [place] spotted fever     Past Surgical History:   Procedure Laterality Date   COLONOSCOPY N/A 12/09/2023   Procedure: COLONOSCOPY;  Surgeon: Cinderella Deatrice FALCON, MD;  Location: [facility];  Service: Endoscopy;  Laterality: N/A;  1:15PM;ASA 1-2   TONSILLECTOMY     WISDOM TOOTH EXTRACTION      Allergies  Allergen Reactions   Wellbutrin  [Bupropion ]     angry    Outpatient Encounter Medications as of 07/10/2024  Medication Sig   acetaminophen  (TYLENOL ) 500 MG tablet Take 500 mg by mouth every 8 (eight) hours as needed. FOR ARTHRITIS   albuterol  (VENTOLIN  HFA) 108 (90 Base) MCG/ACT inhaler Inhale 2 puffs into the lungs every 6 (six) hours as needed for wheezing or shortness of breath.   cephALEXin  (KEFLEX ) 500 MG capsule Take 1 capsule (500 mg total) by mouth 4 (four) times daily for 7 days.   Multiple Vitamin (MULTIVITAMIN) tablet Take 1 tablet by mouth daily.   hyoscyamine  (LEVSIN  SL) 0.125 MG SL tablet Place 1 tablet (0.125 mg total) under the tongue 4 (four) times daily as needed. (Patient not taking: Reported on 07/10/2024)   polyethylene glycol powder (GLYCOLAX /MIRALAX ) 17 GM/SCOOP powder Take 8.5 g by mouth in the morning and at bedtime. (Patient not taking: Reported on 07/10/2024)   No facility-administered encounter medications on file as of 07/10/2024.    Review of Systems:  Review  of Systems  Constitutional:  Negative for activity change, appetite change and fever.  HENT:  Negative for sore throat.   Eyes: Negative.   Cardiovascular:  Negative for chest pain and leg swelling.  Gastrointestinal:  Negative for abdominal distention, diarrhea and vomiting.  Genitourinary:  Negative for dysuria, frequency and urgency.  Musculoskeletal:        Right middle finger tender to touch  Skin:  Negative for color change.  Neurological:  Negative for dizziness and headaches.  Psychiatric/Behavioral:  Negative for behavioral problems and sleep disturbance. The patient is not nervous/anxious.     Health Maintenance  Topic Date Due    Pneumococcal Vaccine (1 of 2 - PCV) Never done   Hepatitis B Vaccines 19-59 Average Risk (1 of 3 - 19+ 3-dose series) Never done   HPV VACCINES (1 - 3-dose SCDM series) Never done   COVID-19 Vaccine (1 - 2025-26 season) 07/26/2024 (Originally 05/04/2024)   Influenza Vaccine  12/01/2024 (Originally 04/03/2024)   DTaP/Tdap/Td (2 - Td or Tdap) 07/12/2026   Colonoscopy  12/09/2026   Hepatitis C Screening  Completed   HIV Screening  Completed   Meningococcal B Vaccine  Aged Out    Physical Exam: Vitals:   07/10/24 1047  BP: 116/78  Pulse: 82  Temp: 98.4 F (36.9 C)  TempSrc: Temporal  SpO2: 95%  Weight: 185 lb 12.8 oz (84.3 kg)  Height: 5' 6 (1.676 m)   Body mass index is 29.99 kg/m. Physical Exam Constitutional:      Appearance: Normal appearance.  HENT:     Head: Normocephalic and atraumatic.     Mouth/Throat:     Mouth: Mucous membranes are moist.  Eyes:     Conjunctiva/sclera: Conjunctivae normal.  Cardiovascular:     Rate and Rhythm: Normal rate and regular rhythm.     Pulses: Normal pulses.     Heart sounds: Normal heart sounds.  Pulmonary:     Effort: Pulmonary effort is normal.     Breath sounds: Normal breath sounds.  Abdominal:     General: Bowel sounds are normal.     Palpations: Abdomen is soft.  Musculoskeletal:        General: Swelling present.     Cervical back: Normal range of motion.     Comments: Right middle finger erythema, open wound 0.2 cm, tender to touch, not able to bend  Skin:    General: Skin is warm and dry.  Neurological:     General: No focal deficit present.     Mental Status: He is alert and oriented to person, place, and time.  Psychiatric:        Mood and Affect: Mood normal.        Behavior: Behavior normal.        Thought Content: Thought content normal.        Judgment: Judgment normal.     Labs reviewed: Basic Metabolic Panel: Recent Labs    10/27/23 1506 11/25/23 1437  NA 139 140  K 4.0 4.8  CL 104 101  CO2 27 25   GLUCOSE 108* 74  BUN 13 12  CREATININE 1.11 1.22  CALCIUM 9.2 10.0   Liver Function Tests: Recent Labs    11/25/23 1437  AST 15  ALT 22  ALKPHOS 72  BILITOT 0.5  PROT 7.2  ALBUMIN 4.7   No results for input(s): LIPASE, AMYLASE in the last 8760 hours. No results for input(s): AMMONIA in the last 8760 hours. CBC: Recent Labs  10/27/23 1506  WBC 6.6  HGB 16.0  HCT 46.8  MCV 89.8  PLT 382   Lipid Panel: No results for input(s): CHOL, HDL, LDLCALC, TRIG, CHOLHDL, LDLDIRECT in the last 8760 hours. No results found for: HGBA1C  Procedures since last visit: No results found.  Assessment/Plan  1. Cellulitis of finger of right hand (Primary) -  Cellulitis likely secondary to contaminated cut. Symptoms include erythema, swelling, tenderness, and limited mobility. No fever. Worsening pain and redness noted. - Prescribed cephalexin  500 mg QID for 7 days. - Advised to keep area clean and wear gloves to prevent contamination. - Instructed to return if no improvement in 3 days.  - cephALEXin  (KEFLEX ) 500 MG capsule; Take 1 capsule (500 mg total) by mouth 4 (four) times daily for 7 days.  Dispense: 28 capsule; Refill: 0     Labs/tests ordered:  None   Return if symptoms worsen or fail to improve.  Lisaann Atha Medina-Vargas, NP

## 2024-11-27 ENCOUNTER — Encounter: Payer: Self-pay | Admitting: Family
